# Patient Record
Sex: Female | Born: 1945 | Race: White | Hispanic: No | Marital: Married | Smoking: Current every day smoker
Health system: Southern US, Community
[De-identification: ages and names within clinical notes are randomized; demographics above are authoritative.]

## PROBLEM LIST (undated history)

## (undated) DIAGNOSIS — F419 Anxiety disorder, unspecified: Secondary | ICD-10-CM

## (undated) DIAGNOSIS — E039 Hypothyroidism, unspecified: Secondary | ICD-10-CM

## (undated) DIAGNOSIS — M1990 Unspecified osteoarthritis, unspecified site: Secondary | ICD-10-CM

## (undated) DIAGNOSIS — I10 Essential (primary) hypertension: Secondary | ICD-10-CM

## (undated) DIAGNOSIS — G629 Polyneuropathy, unspecified: Secondary | ICD-10-CM

## (undated) DIAGNOSIS — N182 Chronic kidney disease, stage 2 (mild): Secondary | ICD-10-CM

## (undated) DIAGNOSIS — K219 Gastro-esophageal reflux disease without esophagitis: Secondary | ICD-10-CM

## (undated) DIAGNOSIS — E119 Type 2 diabetes mellitus without complications: Secondary | ICD-10-CM

---

## 2001-05-11 ENCOUNTER — Ambulatory Visit (HOSPITAL_COMMUNITY): Admission: RE | Admit: 2001-05-11 | Discharge: 2001-05-11 | Payer: Self-pay

## 2017-06-14 DIAGNOSIS — E871 Hypo-osmolality and hyponatremia: Secondary | ICD-10-CM | POA: Diagnosis not present

## 2017-06-14 DIAGNOSIS — I10 Essential (primary) hypertension: Secondary | ICD-10-CM | POA: Diagnosis not present

## 2017-06-14 DIAGNOSIS — E876 Hypokalemia: Secondary | ICD-10-CM | POA: Diagnosis not present

## 2017-06-14 DIAGNOSIS — R531 Weakness: Secondary | ICD-10-CM | POA: Diagnosis not present

## 2017-06-14 DIAGNOSIS — F418 Other specified anxiety disorders: Secondary | ICD-10-CM | POA: Diagnosis not present

## 2017-06-14 DIAGNOSIS — Z720 Tobacco use: Secondary | ICD-10-CM | POA: Diagnosis not present

## 2017-06-14 DIAGNOSIS — R739 Hyperglycemia, unspecified: Secondary | ICD-10-CM

## 2017-06-14 DIAGNOSIS — E875 Hyperkalemia: Secondary | ICD-10-CM | POA: Diagnosis not present

## 2017-06-14 DIAGNOSIS — Z9114 Patient's other noncompliance with medication regimen: Secondary | ICD-10-CM

## 2017-06-14 DIAGNOSIS — E860 Dehydration: Secondary | ICD-10-CM | POA: Diagnosis not present

## 2017-06-15 DIAGNOSIS — Z720 Tobacco use: Secondary | ICD-10-CM | POA: Diagnosis not present

## 2017-06-15 DIAGNOSIS — R739 Hyperglycemia, unspecified: Secondary | ICD-10-CM | POA: Diagnosis not present

## 2017-06-15 DIAGNOSIS — E860 Dehydration: Secondary | ICD-10-CM | POA: Diagnosis not present

## 2017-06-15 DIAGNOSIS — F418 Other specified anxiety disorders: Secondary | ICD-10-CM | POA: Diagnosis not present

## 2017-06-15 DIAGNOSIS — E871 Hypo-osmolality and hyponatremia: Secondary | ICD-10-CM | POA: Diagnosis not present

## 2017-06-15 DIAGNOSIS — E876 Hypokalemia: Secondary | ICD-10-CM | POA: Diagnosis not present

## 2017-06-15 DIAGNOSIS — I10 Essential (primary) hypertension: Secondary | ICD-10-CM | POA: Diagnosis not present

## 2017-06-16 DIAGNOSIS — E860 Dehydration: Secondary | ICD-10-CM | POA: Diagnosis not present

## 2017-06-16 DIAGNOSIS — R739 Hyperglycemia, unspecified: Secondary | ICD-10-CM | POA: Diagnosis not present

## 2017-06-16 DIAGNOSIS — E876 Hypokalemia: Secondary | ICD-10-CM | POA: Diagnosis not present

## 2017-06-16 DIAGNOSIS — E871 Hypo-osmolality and hyponatremia: Secondary | ICD-10-CM | POA: Diagnosis not present

## 2017-06-17 DIAGNOSIS — E860 Dehydration: Secondary | ICD-10-CM | POA: Diagnosis not present

## 2017-06-17 DIAGNOSIS — E871 Hypo-osmolality and hyponatremia: Secondary | ICD-10-CM | POA: Diagnosis not present

## 2017-06-17 DIAGNOSIS — E876 Hypokalemia: Secondary | ICD-10-CM | POA: Diagnosis not present

## 2017-06-17 DIAGNOSIS — R739 Hyperglycemia, unspecified: Secondary | ICD-10-CM | POA: Diagnosis not present

## 2017-06-18 DIAGNOSIS — E871 Hypo-osmolality and hyponatremia: Secondary | ICD-10-CM | POA: Diagnosis not present

## 2017-06-18 DIAGNOSIS — E876 Hypokalemia: Secondary | ICD-10-CM | POA: Diagnosis not present

## 2017-06-18 DIAGNOSIS — R739 Hyperglycemia, unspecified: Secondary | ICD-10-CM | POA: Diagnosis not present

## 2017-06-18 DIAGNOSIS — F418 Other specified anxiety disorders: Secondary | ICD-10-CM | POA: Diagnosis not present

## 2017-06-18 DIAGNOSIS — I10 Essential (primary) hypertension: Secondary | ICD-10-CM | POA: Diagnosis not present

## 2017-06-18 DIAGNOSIS — E875 Hyperkalemia: Secondary | ICD-10-CM | POA: Diagnosis not present

## 2017-06-18 DIAGNOSIS — E860 Dehydration: Secondary | ICD-10-CM | POA: Diagnosis not present

## 2017-06-18 DIAGNOSIS — Z720 Tobacco use: Secondary | ICD-10-CM | POA: Diagnosis not present

## 2017-06-18 DIAGNOSIS — R531 Weakness: Secondary | ICD-10-CM | POA: Diagnosis not present

## 2017-06-18 DIAGNOSIS — Z9114 Patient's other noncompliance with medication regimen: Secondary | ICD-10-CM | POA: Diagnosis not present

## 2019-02-23 DIAGNOSIS — E119 Type 2 diabetes mellitus without complications: Secondary | ICD-10-CM | POA: Diagnosis not present

## 2019-02-23 DIAGNOSIS — I2510 Atherosclerotic heart disease of native coronary artery without angina pectoris: Secondary | ICD-10-CM | POA: Diagnosis not present

## 2019-02-23 DIAGNOSIS — G894 Chronic pain syndrome: Secondary | ICD-10-CM | POA: Diagnosis not present

## 2019-02-23 DIAGNOSIS — I10 Essential (primary) hypertension: Secondary | ICD-10-CM | POA: Diagnosis not present

## 2019-02-25 DIAGNOSIS — R1030 Lower abdominal pain, unspecified: Secondary | ICD-10-CM | POA: Diagnosis not present

## 2019-02-25 DIAGNOSIS — H1131 Conjunctival hemorrhage, right eye: Secondary | ICD-10-CM | POA: Diagnosis not present

## 2019-02-25 DIAGNOSIS — Z87440 Personal history of urinary (tract) infections: Secondary | ICD-10-CM | POA: Diagnosis not present

## 2019-02-25 DIAGNOSIS — R4182 Altered mental status, unspecified: Secondary | ICD-10-CM | POA: Diagnosis not present

## 2019-02-26 DIAGNOSIS — N390 Urinary tract infection, site not specified: Secondary | ICD-10-CM | POA: Diagnosis not present

## 2019-03-16 DIAGNOSIS — R451 Restlessness and agitation: Secondary | ICD-10-CM | POA: Diagnosis not present

## 2019-03-16 DIAGNOSIS — F419 Anxiety disorder, unspecified: Secondary | ICD-10-CM | POA: Diagnosis not present

## 2019-03-30 DIAGNOSIS — F419 Anxiety disorder, unspecified: Secondary | ICD-10-CM | POA: Diagnosis not present

## 2019-03-30 DIAGNOSIS — F341 Dysthymic disorder: Secondary | ICD-10-CM | POA: Diagnosis not present

## 2019-04-22 DIAGNOSIS — R54 Age-related physical debility: Secondary | ICD-10-CM | POA: Diagnosis not present

## 2019-04-22 DIAGNOSIS — E1165 Type 2 diabetes mellitus with hyperglycemia: Secondary | ICD-10-CM | POA: Diagnosis not present

## 2019-04-22 DIAGNOSIS — Z9111 Patient's noncompliance with dietary regimen: Secondary | ICD-10-CM | POA: Diagnosis not present

## 2019-04-22 DIAGNOSIS — R635 Abnormal weight gain: Secondary | ICD-10-CM | POA: Diagnosis not present

## 2019-05-04 DIAGNOSIS — F341 Dysthymic disorder: Secondary | ICD-10-CM | POA: Diagnosis not present

## 2019-05-04 DIAGNOSIS — F419 Anxiety disorder, unspecified: Secondary | ICD-10-CM | POA: Diagnosis not present

## 2019-05-04 DIAGNOSIS — F062 Psychotic disorder with delusions due to known physiological condition: Secondary | ICD-10-CM | POA: Diagnosis not present

## 2019-05-04 DIAGNOSIS — R451 Restlessness and agitation: Secondary | ICD-10-CM | POA: Diagnosis not present

## 2019-05-26 DIAGNOSIS — M179 Osteoarthritis of knee, unspecified: Secondary | ICD-10-CM | POA: Diagnosis not present

## 2019-05-26 DIAGNOSIS — F331 Major depressive disorder, recurrent, moderate: Secondary | ICD-10-CM | POA: Diagnosis not present

## 2019-05-26 DIAGNOSIS — I2510 Atherosclerotic heart disease of native coronary artery without angina pectoris: Secondary | ICD-10-CM | POA: Diagnosis not present

## 2019-05-26 DIAGNOSIS — K649 Unspecified hemorrhoids: Secondary | ICD-10-CM | POA: Diagnosis not present

## 2019-05-26 DIAGNOSIS — F22 Delusional disorders: Secondary | ICD-10-CM | POA: Diagnosis not present

## 2019-05-26 DIAGNOSIS — I10 Essential (primary) hypertension: Secondary | ICD-10-CM | POA: Diagnosis not present

## 2019-05-26 DIAGNOSIS — K5900 Constipation, unspecified: Secondary | ICD-10-CM | POA: Diagnosis not present

## 2019-05-26 DIAGNOSIS — G894 Chronic pain syndrome: Secondary | ICD-10-CM | POA: Diagnosis not present

## 2019-05-26 DIAGNOSIS — E785 Hyperlipidemia, unspecified: Secondary | ICD-10-CM | POA: Diagnosis not present

## 2019-05-26 DIAGNOSIS — E559 Vitamin D deficiency, unspecified: Secondary | ICD-10-CM | POA: Diagnosis not present

## 2019-05-26 DIAGNOSIS — E1165 Type 2 diabetes mellitus with hyperglycemia: Secondary | ICD-10-CM | POA: Diagnosis not present

## 2019-05-27 DIAGNOSIS — E785 Hyperlipidemia, unspecified: Secondary | ICD-10-CM | POA: Diagnosis not present

## 2019-05-27 DIAGNOSIS — I10 Essential (primary) hypertension: Secondary | ICD-10-CM | POA: Diagnosis not present

## 2019-05-27 DIAGNOSIS — D649 Anemia, unspecified: Secondary | ICD-10-CM | POA: Diagnosis not present

## 2019-06-01 DIAGNOSIS — E1165 Type 2 diabetes mellitus with hyperglycemia: Secondary | ICD-10-CM | POA: Diagnosis not present

## 2019-06-01 DIAGNOSIS — Z9111 Patient's noncompliance with dietary regimen: Secondary | ICD-10-CM | POA: Diagnosis not present

## 2019-06-01 DIAGNOSIS — Z5181 Encounter for therapeutic drug level monitoring: Secondary | ICD-10-CM | POA: Diagnosis not present

## 2019-06-04 DIAGNOSIS — R739 Hyperglycemia, unspecified: Secondary | ICD-10-CM | POA: Diagnosis not present

## 2019-06-04 DIAGNOSIS — F419 Anxiety disorder, unspecified: Secondary | ICD-10-CM | POA: Diagnosis not present

## 2019-06-04 DIAGNOSIS — E119 Type 2 diabetes mellitus without complications: Secondary | ICD-10-CM | POA: Diagnosis not present

## 2019-06-04 DIAGNOSIS — F0391 Unspecified dementia with behavioral disturbance: Secondary | ICD-10-CM | POA: Diagnosis not present

## 2019-06-15 DIAGNOSIS — F062 Psychotic disorder with delusions due to known physiological condition: Secondary | ICD-10-CM | POA: Diagnosis not present

## 2019-06-15 DIAGNOSIS — F419 Anxiety disorder, unspecified: Secondary | ICD-10-CM | POA: Diagnosis not present

## 2019-06-15 DIAGNOSIS — F341 Dysthymic disorder: Secondary | ICD-10-CM | POA: Diagnosis not present

## 2019-06-15 DIAGNOSIS — R451 Restlessness and agitation: Secondary | ICD-10-CM | POA: Diagnosis not present

## 2019-06-30 DIAGNOSIS — M79642 Pain in left hand: Secondary | ICD-10-CM | POA: Diagnosis not present

## 2019-07-08 DIAGNOSIS — Z23 Encounter for immunization: Secondary | ICD-10-CM | POA: Diagnosis not present

## 2019-07-28 DIAGNOSIS — F0390 Unspecified dementia without behavioral disturbance: Secondary | ICD-10-CM | POA: Diagnosis not present

## 2019-07-28 DIAGNOSIS — I10 Essential (primary) hypertension: Secondary | ICD-10-CM | POA: Diagnosis not present

## 2019-08-02 DIAGNOSIS — M1990 Unspecified osteoarthritis, unspecified site: Secondary | ICD-10-CM | POA: Diagnosis not present

## 2019-08-02 DIAGNOSIS — M25512 Pain in left shoulder: Secondary | ICD-10-CM | POA: Diagnosis not present

## 2019-08-02 DIAGNOSIS — G894 Chronic pain syndrome: Secondary | ICD-10-CM | POA: Diagnosis not present

## 2019-08-02 DIAGNOSIS — R54 Age-related physical debility: Secondary | ICD-10-CM | POA: Diagnosis not present

## 2019-08-03 DIAGNOSIS — F062 Psychotic disorder with delusions due to known physiological condition: Secondary | ICD-10-CM | POA: Diagnosis not present

## 2019-08-03 DIAGNOSIS — F419 Anxiety disorder, unspecified: Secondary | ICD-10-CM | POA: Diagnosis not present

## 2019-08-03 DIAGNOSIS — F341 Dysthymic disorder: Secondary | ICD-10-CM | POA: Diagnosis not present

## 2019-08-04 DIAGNOSIS — M79602 Pain in left arm: Secondary | ICD-10-CM | POA: Diagnosis not present

## 2019-08-04 DIAGNOSIS — I10 Essential (primary) hypertension: Secondary | ICD-10-CM | POA: Diagnosis not present

## 2019-09-01 DIAGNOSIS — Z20828 Contact with and (suspected) exposure to other viral communicable diseases: Secondary | ICD-10-CM | POA: Diagnosis not present

## 2019-09-01 DIAGNOSIS — R54 Age-related physical debility: Secondary | ICD-10-CM | POA: Diagnosis not present

## 2019-09-08 DIAGNOSIS — Z20828 Contact with and (suspected) exposure to other viral communicable diseases: Secondary | ICD-10-CM | POA: Diagnosis not present

## 2019-09-08 DIAGNOSIS — R54 Age-related physical debility: Secondary | ICD-10-CM | POA: Diagnosis not present

## 2019-09-15 DIAGNOSIS — R54 Age-related physical debility: Secondary | ICD-10-CM | POA: Diagnosis not present

## 2019-09-15 DIAGNOSIS — Z20828 Contact with and (suspected) exposure to other viral communicable diseases: Secondary | ICD-10-CM | POA: Diagnosis not present

## 2019-09-22 DIAGNOSIS — Z20828 Contact with and (suspected) exposure to other viral communicable diseases: Secondary | ICD-10-CM | POA: Diagnosis not present

## 2019-09-22 DIAGNOSIS — R54 Age-related physical debility: Secondary | ICD-10-CM | POA: Diagnosis not present

## 2019-09-29 DIAGNOSIS — R54 Age-related physical debility: Secondary | ICD-10-CM | POA: Diagnosis not present

## 2019-09-29 DIAGNOSIS — Z20828 Contact with and (suspected) exposure to other viral communicable diseases: Secondary | ICD-10-CM | POA: Diagnosis not present

## 2019-10-06 DIAGNOSIS — R54 Age-related physical debility: Secondary | ICD-10-CM | POA: Diagnosis not present

## 2019-10-06 DIAGNOSIS — Z20828 Contact with and (suspected) exposure to other viral communicable diseases: Secondary | ICD-10-CM | POA: Diagnosis not present

## 2019-10-13 DIAGNOSIS — R54 Age-related physical debility: Secondary | ICD-10-CM | POA: Diagnosis not present

## 2019-10-13 DIAGNOSIS — Z20828 Contact with and (suspected) exposure to other viral communicable diseases: Secondary | ICD-10-CM | POA: Diagnosis not present

## 2019-10-20 DIAGNOSIS — R54 Age-related physical debility: Secondary | ICD-10-CM | POA: Diagnosis not present

## 2019-10-20 DIAGNOSIS — Z20828 Contact with and (suspected) exposure to other viral communicable diseases: Secondary | ICD-10-CM | POA: Diagnosis not present

## 2019-10-25 DIAGNOSIS — R54 Age-related physical debility: Secondary | ICD-10-CM | POA: Diagnosis not present

## 2019-10-25 DIAGNOSIS — R490 Dysphonia: Secondary | ICD-10-CM | POA: Diagnosis not present

## 2019-10-25 DIAGNOSIS — U071 COVID-19: Secondary | ICD-10-CM | POA: Diagnosis not present

## 2019-10-26 DIAGNOSIS — M2550 Pain in unspecified joint: Secondary | ICD-10-CM | POA: Diagnosis not present

## 2019-10-26 DIAGNOSIS — U071 COVID-19: Secondary | ICD-10-CM | POA: Diagnosis not present

## 2019-10-26 DIAGNOSIS — R062 Wheezing: Secondary | ICD-10-CM | POA: Diagnosis not present

## 2019-10-26 DIAGNOSIS — I10 Essential (primary) hypertension: Secondary | ICD-10-CM | POA: Diagnosis not present

## 2019-10-26 DIAGNOSIS — R0602 Shortness of breath: Secondary | ICD-10-CM | POA: Diagnosis not present

## 2019-10-26 DIAGNOSIS — F17200 Nicotine dependence, unspecified, uncomplicated: Secondary | ICD-10-CM | POA: Diagnosis not present

## 2019-10-26 DIAGNOSIS — J029 Acute pharyngitis, unspecified: Secondary | ICD-10-CM | POA: Diagnosis not present

## 2019-10-26 DIAGNOSIS — Z7401 Bed confinement status: Secondary | ICD-10-CM | POA: Diagnosis not present

## 2019-10-26 DIAGNOSIS — R0902 Hypoxemia: Secondary | ICD-10-CM | POA: Diagnosis not present

## 2019-10-26 DIAGNOSIS — R069 Unspecified abnormalities of breathing: Secondary | ICD-10-CM | POA: Diagnosis not present

## 2019-10-26 DIAGNOSIS — I700 Atherosclerosis of aorta: Secondary | ICD-10-CM | POA: Diagnosis not present

## 2019-10-26 DIAGNOSIS — R410 Disorientation, unspecified: Secondary | ICD-10-CM | POA: Diagnosis not present

## 2019-10-29 ENCOUNTER — Encounter (HOSPITAL_COMMUNITY): Payer: Self-pay | Admitting: Internal Medicine

## 2019-10-29 ENCOUNTER — Other Ambulatory Visit: Payer: Self-pay

## 2019-10-29 ENCOUNTER — Emergency Department (HOSPITAL_COMMUNITY): Payer: Medicare HMO

## 2019-10-29 ENCOUNTER — Inpatient Hospital Stay (HOSPITAL_COMMUNITY)
Admission: EM | Admit: 2019-10-29 | Discharge: 2019-11-06 | DRG: 177 | Disposition: A | Discharge status: Home / Self Care | Payer: Medicare HMO | Attending: Internal Medicine | Admitting: Internal Medicine

## 2019-10-29 DIAGNOSIS — K219 Gastro-esophageal reflux disease without esophagitis: Secondary | ICD-10-CM | POA: Diagnosis present

## 2019-10-29 DIAGNOSIS — T380X5A Adverse effect of glucocorticoids and synthetic analogues, initial encounter: Secondary | ICD-10-CM | POA: Diagnosis not present

## 2019-10-29 DIAGNOSIS — Z794 Long term (current) use of insulin: Secondary | ICD-10-CM

## 2019-10-29 DIAGNOSIS — E1122 Type 2 diabetes mellitus with diabetic chronic kidney disease: Secondary | ICD-10-CM

## 2019-10-29 DIAGNOSIS — Z7401 Bed confinement status: Secondary | ICD-10-CM | POA: Diagnosis not present

## 2019-10-29 DIAGNOSIS — J441 Chronic obstructive pulmonary disease with (acute) exacerbation: Secondary | ICD-10-CM | POA: Diagnosis not present

## 2019-10-29 DIAGNOSIS — J9600 Acute respiratory failure, unspecified whether with hypoxia or hypercapnia: Secondary | ICD-10-CM | POA: Diagnosis not present

## 2019-10-29 DIAGNOSIS — J9601 Acute respiratory failure with hypoxia: Secondary | ICD-10-CM

## 2019-10-29 DIAGNOSIS — F17200 Nicotine dependence, unspecified, uncomplicated: Secondary | ICD-10-CM | POA: Diagnosis present

## 2019-10-29 DIAGNOSIS — F419 Anxiety disorder, unspecified: Secondary | ICD-10-CM | POA: Diagnosis present

## 2019-10-29 DIAGNOSIS — I129 Hypertensive chronic kidney disease with stage 1 through stage 4 chronic kidney disease, or unspecified chronic kidney disease: Secondary | ICD-10-CM | POA: Diagnosis present

## 2019-10-29 DIAGNOSIS — M2550 Pain in unspecified joint: Secondary | ICD-10-CM | POA: Diagnosis not present

## 2019-10-29 DIAGNOSIS — N182 Chronic kidney disease, stage 2 (mild): Secondary | ICD-10-CM

## 2019-10-29 DIAGNOSIS — Z7982 Long term (current) use of aspirin: Secondary | ICD-10-CM | POA: Diagnosis not present

## 2019-10-29 DIAGNOSIS — N179 Acute kidney failure, unspecified: Secondary | ICD-10-CM

## 2019-10-29 DIAGNOSIS — E119 Type 2 diabetes mellitus without complications: Secondary | ICD-10-CM

## 2019-10-29 DIAGNOSIS — U071 COVID-19: Principal | ICD-10-CM | POA: Diagnosis present

## 2019-10-29 DIAGNOSIS — R000 Tachycardia, unspecified: Secondary | ICD-10-CM | POA: Diagnosis not present

## 2019-10-29 DIAGNOSIS — D631 Anemia in chronic kidney disease: Secondary | ICD-10-CM | POA: Diagnosis present

## 2019-10-29 DIAGNOSIS — E1142 Type 2 diabetes mellitus with diabetic polyneuropathy: Secondary | ICD-10-CM | POA: Diagnosis present

## 2019-10-29 DIAGNOSIS — R0902 Hypoxemia: Secondary | ICD-10-CM | POA: Diagnosis not present

## 2019-10-29 DIAGNOSIS — Z8249 Family history of ischemic heart disease and other diseases of the circulatory system: Secondary | ICD-10-CM | POA: Diagnosis not present

## 2019-10-29 DIAGNOSIS — R509 Fever, unspecified: Secondary | ICD-10-CM | POA: Diagnosis not present

## 2019-10-29 DIAGNOSIS — E039 Hypothyroidism, unspecified: Secondary | ICD-10-CM | POA: Diagnosis present

## 2019-10-29 DIAGNOSIS — I700 Atherosclerosis of aorta: Secondary | ICD-10-CM | POA: Diagnosis present

## 2019-10-29 DIAGNOSIS — F329 Major depressive disorder, single episode, unspecified: Secondary | ICD-10-CM | POA: Diagnosis present

## 2019-10-29 DIAGNOSIS — T501X5A Adverse effect of loop [high-ceiling] diuretics, initial encounter: Secondary | ICD-10-CM | POA: Diagnosis not present

## 2019-10-29 DIAGNOSIS — R0602 Shortness of breath: Secondary | ICD-10-CM | POA: Diagnosis not present

## 2019-10-29 DIAGNOSIS — E871 Hypo-osmolality and hyponatremia: Secondary | ICD-10-CM | POA: Diagnosis not present

## 2019-10-29 DIAGNOSIS — I10 Essential (primary) hypertension: Secondary | ICD-10-CM | POA: Diagnosis not present

## 2019-10-29 DIAGNOSIS — E1165 Type 2 diabetes mellitus with hyperglycemia: Secondary | ICD-10-CM | POA: Diagnosis not present

## 2019-10-29 DIAGNOSIS — J9811 Atelectasis: Secondary | ICD-10-CM | POA: Diagnosis not present

## 2019-10-29 DIAGNOSIS — M1990 Unspecified osteoarthritis, unspecified site: Secondary | ICD-10-CM | POA: Diagnosis present

## 2019-10-29 DIAGNOSIS — R0989 Other specified symptoms and signs involving the circulatory and respiratory systems: Secondary | ICD-10-CM | POA: Diagnosis not present

## 2019-10-29 DIAGNOSIS — R439 Unspecified disturbances of smell and taste: Secondary | ICD-10-CM | POA: Diagnosis present

## 2019-10-29 DIAGNOSIS — R54 Age-related physical debility: Secondary | ICD-10-CM | POA: Diagnosis not present

## 2019-10-29 DIAGNOSIS — R05 Cough: Secondary | ICD-10-CM | POA: Diagnosis not present

## 2019-10-29 HISTORY — DX: Type 2 diabetes mellitus without complications: E11.9

## 2019-10-29 HISTORY — DX: Unspecified osteoarthritis, unspecified site: M19.90

## 2019-10-29 HISTORY — DX: Hypothyroidism, unspecified: E03.9

## 2019-10-29 HISTORY — DX: Chronic kidney disease, stage 2 (mild): N18.2

## 2019-10-29 HISTORY — DX: Gastro-esophageal reflux disease without esophagitis: K21.9

## 2019-10-29 HISTORY — DX: Essential (primary) hypertension: I10

## 2019-10-29 HISTORY — DX: Anxiety disorder, unspecified: F41.9

## 2019-10-29 HISTORY — DX: Polyneuropathy, unspecified: G62.9

## 2019-10-29 LAB — COMPREHENSIVE METABOLIC PANEL
ALT: 14 U/L (ref 0–44)
AST: 26 U/L (ref 15–41)
Albumin: 2.9 g/dL — ABNORMAL LOW (ref 3.5–5.0)
Alkaline Phosphatase: 74 U/L (ref 38–126)
Anion gap: 10 (ref 5–15)
BUN: 36 mg/dL — ABNORMAL HIGH (ref 8–23)
CO2: 20 mmol/L — ABNORMAL LOW (ref 22–32)
Calcium: 8.2 mg/dL — ABNORMAL LOW (ref 8.9–10.3)
Chloride: 101 mmol/L (ref 98–111)
Creatinine, Ser: 2.24 mg/dL — ABNORMAL HIGH (ref 0.44–1.00)
GFR calc Af Amer: 24 mL/min — ABNORMAL LOW (ref >60)
GFR calc non Af Amer: 21 mL/min — ABNORMAL LOW (ref >60)
Glucose, Bld: 104 mg/dL — ABNORMAL HIGH (ref 70–99)
Potassium: 4.6 mmol/L (ref 3.5–5.1)
Sodium: 131 mmol/L — ABNORMAL LOW (ref 135–145)
Total Bilirubin: 0.6 mg/dL (ref 0.3–1.2)
Total Protein: 5.8 g/dL — ABNORMAL LOW (ref 6.5–8.1)

## 2019-10-29 LAB — D-DIMER, QUANTITATIVE: D-Dimer, Quant: 1.29 ug/mL-FEU — ABNORMAL HIGH (ref 0.00–0.50)

## 2019-10-29 LAB — CBC WITH DIFFERENTIAL/PLATELET
Abs Immature Granulocytes: 0.03 10*3/uL (ref 0.00–0.07)
Basophils Absolute: 0 10*3/uL (ref 0.0–0.1)
Basophils Relative: 0 %
Eosinophils Absolute: 0.1 10*3/uL (ref 0.0–0.5)
Eosinophils Relative: 1 %
HCT: 37.6 % (ref 36.0–46.0)
Hemoglobin: 11 g/dL — ABNORMAL LOW (ref 12.0–15.0)
Immature Granulocytes: 0 %
Lymphocytes Relative: 15 %
Lymphs Abs: 1.4 10*3/uL (ref 0.7–4.0)
MCH: 26.4 pg (ref 26.0–34.0)
MCHC: 29.3 g/dL — ABNORMAL LOW (ref 30.0–36.0)
MCV: 90.2 fL (ref 80.0–100.0)
Monocytes Absolute: 0.9 10*3/uL (ref 0.1–1.0)
Monocytes Relative: 10 %
Neutro Abs: 6.6 10*3/uL (ref 1.7–7.7)
Neutrophils Relative %: 74 %
Platelets: 244 10*3/uL (ref 150–400)
RBC: 4.17 MIL/uL (ref 3.87–5.11)
RDW: 14.6 % (ref 11.5–15.5)
WBC: 8.9 10*3/uL (ref 4.0–10.5)
nRBC: 0 % (ref 0.0–0.2)

## 2019-10-29 LAB — FERRITIN: Ferritin: 23 ng/mL (ref 11–307)

## 2019-10-29 LAB — HEMOGLOBIN A1C
Hgb A1c MFr Bld: 8.3 % — ABNORMAL HIGH (ref 4.8–5.6)
Mean Plasma Glucose: 191.51 mg/dL

## 2019-10-29 LAB — POC SARS CORONAVIRUS 2 AG -  ED: SARS Coronavirus 2 Ag: POSITIVE — AB

## 2019-10-29 LAB — LACTATE DEHYDROGENASE: LDH: 291 U/L — ABNORMAL HIGH (ref 98–192)

## 2019-10-29 LAB — PROCALCITONIN: Procalcitonin: 0.31 ng/mL

## 2019-10-29 LAB — CBG MONITORING, ED: Glucose-Capillary: 73 mg/dL (ref 70–99)

## 2019-10-29 LAB — C-REACTIVE PROTEIN: CRP: 5.5 mg/dL — ABNORMAL HIGH (ref <1.0)

## 2019-10-29 LAB — LACTIC ACID, PLASMA: Lactic Acid, Venous: 1.1 mmol/L (ref 0.5–1.9)

## 2019-10-29 LAB — TRIGLYCERIDES: Triglycerides: 161 mg/dL — ABNORMAL HIGH (ref <150)

## 2019-10-29 LAB — FIBRINOGEN: Fibrinogen: 545 mg/dL — ABNORMAL HIGH (ref 210–475)

## 2019-10-29 MED ORDER — PANTOPRAZOLE SODIUM 40 MG PO TBEC
80.0000 mg | DELAYED_RELEASE_TABLET | Freq: Every day | ORAL | Status: DC
  Administered 2019-10-30 – 2019-11-05 (×7): 80 mg via ORAL
  Filled 2019-10-29 (×7): qty 2

## 2019-10-29 MED ORDER — LORAZEPAM 0.5 MG PO TABS
0.2500 mg | ORAL_TABLET | Freq: Every day | ORAL | Status: DC
  Administered 2019-10-30 – 2019-11-05 (×7): 0.25 mg via ORAL
  Filled 2019-10-29 (×8): qty 1

## 2019-10-29 MED ORDER — ZINC SULFATE 220 (50 ZN) MG PO CAPS
220.0000 mg | ORAL_CAPSULE | Freq: Every day | ORAL | Status: DC
  Administered 2019-10-30 – 2019-11-05 (×7): 220 mg via ORAL
  Filled 2019-10-29 (×7): qty 1

## 2019-10-29 MED ORDER — VENLAFAXINE HCL ER 75 MG PO CP24
150.0000 mg | ORAL_CAPSULE | Freq: Every day | ORAL | Status: DC
  Administered 2019-10-30 – 2019-11-05 (×7): 150 mg via ORAL
  Filled 2019-10-29: qty 1
  Filled 2019-10-29 (×6): qty 2

## 2019-10-29 MED ORDER — GUAIFENESIN-DM 100-10 MG/5ML PO SYRP
10.0000 mL | ORAL_SOLUTION | ORAL | Status: DC | PRN
  Administered 2019-10-30 – 2019-11-03 (×3): 10 mL via ORAL
  Filled 2019-10-29 (×3): qty 10

## 2019-10-29 MED ORDER — LORAZEPAM 0.5 MG PO TABS
0.2500 mg | ORAL_TABLET | Freq: Two times a day (BID) | ORAL | Status: DC | PRN
  Administered 2019-10-30 (×2): 0.25 mg via ORAL
  Filled 2019-10-29: qty 1

## 2019-10-29 MED ORDER — ONDANSETRON HCL 4 MG PO TABS: 4.0000 mg | ORAL_TABLET | Freq: Four times a day (QID) | ORAL | Status: DC | PRN

## 2019-10-29 MED ORDER — MELATONIN 3 MG PO TABS
6.0000 mg | ORAL_TABLET | Freq: Every evening | ORAL | Status: DC | PRN
  Administered 2019-11-03: 6 mg via ORAL
  Filled 2019-10-29 (×3): qty 2

## 2019-10-29 MED ORDER — AMLODIPINE BESYLATE 10 MG PO TABS
10.0000 mg | ORAL_TABLET | Freq: Every day | ORAL | Status: DC
  Administered 2019-10-30 – 2019-11-05 (×7): 10 mg via ORAL
  Filled 2019-10-29: qty 2
  Filled 2019-10-29 (×2): qty 1
  Filled 2019-10-29: qty 2
  Filled 2019-10-29 (×2): qty 1
  Filled 2019-10-29: qty 2
  Filled 2019-10-29 (×2): qty 1

## 2019-10-29 MED ORDER — SODIUM CHLORIDE 0.9 % IV SOLN: Freq: Once | INTRAVENOUS | Status: AC

## 2019-10-29 MED ORDER — ALBUTEROL SULFATE HFA 108 (90 BASE) MCG/ACT IN AERS
2.0000 | INHALATION_SPRAY | Freq: Four times a day (QID) | RESPIRATORY_TRACT | Status: DC
  Administered 2019-10-29 – 2019-10-31 (×6): 2 via RESPIRATORY_TRACT
  Filled 2019-10-29 (×2): qty 6.7

## 2019-10-29 MED ORDER — SODIUM CHLORIDE 0.9 % IV SOLN
100.0000 mg | Freq: Every day | INTRAVENOUS | Status: AC
  Administered 2019-10-30 – 2019-11-02 (×4): 100 mg via INTRAVENOUS
  Filled 2019-10-29 (×4): qty 20

## 2019-10-29 MED ORDER — RISPERIDONE 0.5 MG PO TABS
0.2500 mg | ORAL_TABLET | Freq: Every day | ORAL | Status: DC
  Administered 2019-10-29 – 2019-11-05 (×8): 0.25 mg via ORAL
  Filled 2019-10-29 (×9): qty 1

## 2019-10-29 MED ORDER — MIRTAZAPINE 15 MG PO TABS
45.0000 mg | ORAL_TABLET | Freq: Every day | ORAL | Status: DC
  Administered 2019-10-30 – 2019-11-05 (×8): 45 mg via ORAL
  Filled 2019-10-29 (×8): qty 3
  Filled 2019-10-29: qty 1

## 2019-10-29 MED ORDER — SODIUM CHLORIDE 0.9 % IV BOLUS
500.0000 mL | Freq: Once | INTRAVENOUS | Status: AC
  Administered 2019-10-29: 500 mL via INTRAVENOUS

## 2019-10-29 MED ORDER — HEPARIN SODIUM (PORCINE) 5000 UNIT/ML IJ SOLN
5000.0000 [IU] | Freq: Three times a day (TID) | INTRAMUSCULAR | Status: DC
  Administered 2019-10-29 – 2019-11-05 (×19): 5000 [IU] via SUBCUTANEOUS
  Filled 2019-10-29 (×19): qty 1

## 2019-10-29 MED ORDER — SIMVASTATIN 20 MG PO TABS
20.0000 mg | ORAL_TABLET | Freq: Every day | ORAL | Status: DC
  Administered 2019-10-30 – 2019-11-05 (×8): 20 mg via ORAL
  Filled 2019-10-29 (×8): qty 1

## 2019-10-29 MED ORDER — SODIUM CHLORIDE 0.9 % IV SOLN
200.0000 mg | Freq: Once | INTRAVENOUS | Status: AC
  Administered 2019-10-29: 200 mg via INTRAVENOUS
  Filled 2019-10-29: qty 40

## 2019-10-29 MED ORDER — DEXAMETHASONE 6 MG PO TABS
6.0000 mg | ORAL_TABLET | ORAL | Status: DC
  Administered 2019-10-29 – 2019-11-05 (×8): 6 mg via ORAL
  Filled 2019-10-29 (×5): qty 1
  Filled 2019-10-29: qty 2
  Filled 2019-10-29: qty 1
  Filled 2019-10-29: qty 2

## 2019-10-29 MED ORDER — ASPIRIN EC 81 MG PO TBEC
81.0000 mg | DELAYED_RELEASE_TABLET | Freq: Every day | ORAL | Status: DC
  Administered 2019-10-30 – 2019-11-05 (×7): 81 mg via ORAL
  Filled 2019-10-29 (×7): qty 1

## 2019-10-29 MED ORDER — ASCORBIC ACID 500 MG PO TABS
500.0000 mg | ORAL_TABLET | Freq: Every day | ORAL | Status: DC
  Administered 2019-10-30 – 2019-11-05 (×7): 500 mg via ORAL
  Filled 2019-10-29 (×7): qty 1

## 2019-10-29 MED ORDER — INSULIN ASPART 100 UNIT/ML ~~LOC~~ SOLN
0.0000 [IU] | Freq: Three times a day (TID) | SUBCUTANEOUS | Status: DC
  Administered 2019-10-30: 3 [IU] via SUBCUTANEOUS
  Administered 2019-10-30: 5 [IU] via SUBCUTANEOUS
  Administered 2019-10-30: 3 [IU] via SUBCUTANEOUS
  Administered 2019-10-31: 5 [IU] via SUBCUTANEOUS
  Administered 2019-10-31: 11 [IU] via SUBCUTANEOUS
  Administered 2019-10-31: 2 [IU] via SUBCUTANEOUS
  Administered 2019-11-01: 3 [IU] via SUBCUTANEOUS
  Administered 2019-11-01: 5 [IU] via SUBCUTANEOUS
  Administered 2019-11-01: 3 [IU] via SUBCUTANEOUS
  Administered 2019-11-02: 2 [IU] via SUBCUTANEOUS
  Administered 2019-11-03: 5 [IU] via SUBCUTANEOUS
  Administered 2019-11-04: 8 [IU] via SUBCUTANEOUS
  Administered 2019-11-04: 5 [IU] via SUBCUTANEOUS
  Administered 2019-11-04: 11 [IU] via SUBCUTANEOUS
  Administered 2019-11-05: 3 [IU] via SUBCUTANEOUS
  Administered 2019-11-05: 5 [IU] via SUBCUTANEOUS
  Administered 2019-11-05: 8 [IU] via SUBCUTANEOUS

## 2019-10-29 MED ORDER — HYDROCOD POLST-CPM POLST ER 10-8 MG/5ML PO SUER: 5.0000 mL | Freq: Two times a day (BID) | ORAL | Status: DC | PRN

## 2019-10-29 MED ORDER — BACID PO TABS
1.0000 | ORAL_TABLET | Freq: Every day | ORAL | Status: DC
  Administered 2019-10-30 – 2019-11-05 (×7): 1 via ORAL
  Filled 2019-10-29 (×7): qty 1

## 2019-10-29 MED ORDER — SODIUM CHLORIDE 0.9 % IV SOLN: INTRAVENOUS | Status: DC

## 2019-10-29 MED ORDER — ACETAMINOPHEN 325 MG PO TABS: 650.0000 mg | ORAL_TABLET | Freq: Four times a day (QID) | ORAL | Status: DC | PRN

## 2019-10-29 MED ORDER — VITAMIN D 25 MCG (1000 UNIT) PO TABS
1000.0000 [IU] | ORAL_TABLET | Freq: Every day | ORAL | Status: DC
  Administered 2019-10-30 – 2019-11-05 (×7): 1000 [IU] via ORAL
  Filled 2019-10-29 (×7): qty 1

## 2019-10-29 MED ORDER — INSULIN GLARGINE 100 UNIT/ML ~~LOC~~ SOLN
40.0000 [IU] | Freq: Every day | SUBCUTANEOUS | Status: DC
  Administered 2019-10-30 – 2019-11-05 (×7): 40 [IU] via SUBCUTANEOUS
  Filled 2019-10-29 (×8): qty 0.4

## 2019-10-29 MED ORDER — FLUTICASONE PROPIONATE HFA 44 MCG/ACT IN AERO
1.0000 | INHALATION_SPRAY | Freq: Two times a day (BID) | RESPIRATORY_TRACT | Status: DC
  Administered 2019-10-30 – 2019-11-05 (×14): 1 via RESPIRATORY_TRACT
  Filled 2019-10-29: qty 10.6

## 2019-10-29 MED ORDER — ENOXAPARIN SODIUM 40 MG/0.4ML ~~LOC~~ SOLN: 40.0000 mg | SUBCUTANEOUS | Status: DC

## 2019-10-29 MED ORDER — TRAZODONE HCL 50 MG PO TABS
100.0000 mg | ORAL_TABLET | Freq: Every day | ORAL | Status: DC
  Administered 2019-10-30 – 2019-11-05 (×8): 100 mg via ORAL
  Filled 2019-10-29 (×2): qty 2
  Filled 2019-10-29: qty 1
  Filled 2019-10-29: qty 2
  Filled 2019-10-29: qty 1
  Filled 2019-10-29 (×3): qty 2

## 2019-10-29 MED ORDER — ONDANSETRON HCL 4 MG/2ML IJ SOLN: 4.0000 mg | Freq: Four times a day (QID) | INTRAMUSCULAR | Status: DC | PRN

## 2019-10-29 MED ORDER — POLYVINYL ALCOHOL 1.4 % OP SOLN
Freq: Two times a day (BID) | OPHTHALMIC | Status: DC
  Administered 2019-10-30 – 2019-11-01 (×2): 2 [drp] via OPHTHALMIC
  Filled 2019-10-29: qty 15

## 2019-10-29 NOTE — H&P (Signed)
History and Physical    Kathy Caldwell [id number] DOB: 1946/03/23 DOA: 10/29/2019  PCP: Patient, No Pcp Per  Patient coming from: [place] have personally briefly reviewed patient's old medical records in [place]  Chief Complaint: Hypoxia  HPI: Kathy Caldwell is a 74 y.o. female with medical history significant of DM2, HTN.  Patient tested positive for COVID several days ago.  Patient seen at [facility] on 1/5, CT chest neg and not hypoxic then.  Patient feeling worse since that visit.  Has loss of taste but not smell.  No fever, has cough.  Was satting 84% at NH today so stent in by EMS.   ED Course: Satting 86% with ambulation on room air, improved to 90s on 2L.  COVID positive.  CRP 5.5, D.Dimer 1.29.  Creat 2.2 up from 1.4 baseline, was 1.6 just 3 days ago at [facility].   Review of Systems: As per HPI, otherwise all review of systems negative.  Past Medical History:  Diagnosis Date  . Anxiety   . CKD (chronic kidney disease) stage 2, GFR 60-89 ml/min   . DM2 (diabetes mellitus, type 2) ([place])   . GERD (gastroesophageal reflux disease)   . HTN (hypertension)   . Hypothyroidism   . Osteoarthritis   . Peripheral neuropathy     History reviewed. No pertinent surgical history.   reports that she has been smoking. She does not have any smokeless tobacco history on file. She reports previous alcohol use. She reports that she does not use drugs.  No Known Allergies  Family History  Problem Relation Age of Onset  . Hypertension Mother   . Hypertension Father   . COPD Other      Prior to Admission medications   Medication Sig Start Date End Date Taking? Authorizing Provider  acetaminophen (TYLENOL) 325 MG tablet Take 650 mg by mouth 4 (four) times daily.   Yes [provider]  allopurinol (ZYLOPRIM) 100 MG tablet Take 100 mg by mouth daily. 08/23/19  Yes [provider]  amLODipine (NORVASC) 10 MG tablet Take 10 mg by mouth daily.  08/18/19  Yes [provider]  Ascorbic Acid (VITAMIN C) 500 MG CAPS Take 500 mg by mouth daily.   Yes [provider]  aspirin EC 81 MG tablet Take 81 mg by mouth daily.   Yes [provider]  Cellulose (UNIFIBER) POWD Take 15 mLs by mouth daily. Mix in 8 oz water and drink   Yes [provider]  cholecalciferol (VITAMIN D3) 25 MCG (1000 UT) tablet Take 1,000 Units by mouth daily.   Yes [provider]  fluticasone (FLOVENT HFA) 44 MCG/ACT inhaler Inhale 1 puff into the lungs 2 (two) times daily.   Yes [provider]  furosemide (LASIX) 20 MG tablet Take 20 mg by mouth daily. 08/23/19  Yes [provider]  glipiZIDE (GLUCOTROL XL) 10 MG 24 hr tablet Take 10 mg by mouth 2 (two) times daily. 08/26/19  Yes [provider]  guaiFENesin-dextromethorphan (ROBITUSSIN DM) 100-10 MG/5ML syrup Take 10 mLs by mouth every 4 (four) hours as needed for cough.   Yes [provider]  insulin aspart (NOVOLOG FLEXPEN) 100 UNIT/ML FlexPen Inject 0-10 Units into the skin See admin instructions. Inject 7 units subcutaneously twice daily at lunch and supper plus 0-10 units  before meals and at bedtime per sliding scale: CBG 0-151 0 units, 151-200 1 units, 201-250 2 units, 251-300 4 units, 301-350 6 units, 351-400 8  units, 401-450 10 units, >450 give 10 units and call MD   Yes [provider]  insulin glargine (LANTUS) 100 unit/mL SOPN Inject 53 Units into the skin at bedtime.   Yes [provider]  Lactobacillus Rhamnosus, GG, (CULTURELLE) CAPS Take 1 capsule by mouth daily.   Yes [provider]  lisinopril (ZESTRIL) 20 MG tablet Take 30 mg by mouth 2 (two) times daily. 08/26/19  Yes [provider]  loperamide (IMODIUM A-D) 2 MG tablet Take 2-4 mg by mouth See admin instructions. Take 2 tablets (4 mg) by mouth after 1st loose stool, then take 1 tablet (2 mg) after each loose stool (no more than 4 tabs in 24  hours)   Yes [provider]  LORazepam (ATIVAN) 0.5 MG tablet Take 0.25 mg by mouth See admin instructions. Take 1/2 tablet (0.25 mg) by mouth daily, may also take 1/2 tablet (0.25 mg) every 12 hours as needed for anxiety/agitation 09/03/19  Yes [provider]  Melatonin 3 MG TABS Take 6 mg by mouth at bedtime as needed (insomnia).   Yes [provider]  Menthol, Topical Analgesic, (BIOFREEZE EX) Apply 1 application topically See admin instructions. Apply topically to left shoulder daily as needed for pain   Yes [provider]  mirtazapine (REMERON) 45 MG tablet Take 45 mg by mouth at bedtime. 08/19/19  Yes [provider]  omeprazole (PRILOSEC) 40 MG capsule Take 40 mg by mouth daily. 08/13/19  Yes [provider]  OVER THE COUNTER MEDICATION Apply 1 application topically See admin instructions. Preparation H cream - apply topically to external hemorrhoids daily as needed for pain   Yes [provider]  Polyethyl Glycol-Propyl Glycol (SYSTANE OP) Place 1 drop into both eyes 2 (two) times daily.   Yes [provider]  risperiDONE (RISPERDAL) 0.25 MG tablet Take 0.25 mg by mouth at bedtime. 08/30/19  Yes [provider]  simvastatin (ZOCOR) 20 MG tablet Take 20 mg by mouth at bedtime.  08/16/19  Yes [provider]  traZODone (DESYREL) 100 MG tablet Take 100 mg by mouth at bedtime. 08/14/19  Yes [provider]  venlafaxine XR (EFFEXOR-XR) 150 MG 24 hr capsule Take 150 mg by mouth daily. 09/02/19  Yes [provider]  Zinc 50 MG TABS Take 50 mg by mouth daily.   Yes [provider]    Physical Exam: Vitals:   10/29/19 1830 10/29/19 1845 10/29/19 1915 10/29/19 2100  BP: 122/66   (!) 115/55  Pulse:  100 100 (!) 102  Resp: 16 17 20 15   Temp:      TempSrc:      SpO2:  97% 98% 97%    Constitutional: NAD, calm, comfortable Eyes: PERRL, lids and conjunctivae normal ENMT: Mucous  membranes are moist. Posterior pharynx clear of any exudate or lesions.Normal dentition.  Neck: normal, supple, no masses, no thyromegaly Respiratory: clear to auscultation bilaterally, no wheezing, no crackles. Normal respiratory effort. No accessory muscle use.  Cardiovascular: Regular rate and rhythm, no murmurs / rubs / gallops. No extremity edema. 2+ pedal pulses. No carotid bruits.  Abdomen: no tenderness, no masses palpated. No hepatosplenomegaly. Bowel sounds positive.  Musculoskeletal: no clubbing / cyanosis. No joint deformity upper and lower extremities. Good ROM, no contractures. Normal muscle tone.  Skin: no rashes, lesions, ulcers. No induration Neurologic: CN 2-12 grossly intact. Sensation intact, DTR normal. Strength 5/5 in all 4.  Psychiatric: Normal judgment and insight. Alert and oriented x 3. Normal  mood.    Labs on Admission: I have personally reviewed following labs and imaging studies  CBC: Recent Labs  Lab 10/29/19 1904  WBC 8.9  NEUTROABS 6.6  HGB 11.0*  HCT 37.6  MCV 90.2  PLT [id number]   Basic Metabolic Panel: Recent Labs  Lab 10/29/19 1904  NA 131*  K 4.6  CL 101  CO2 20*  GLUCOSE 104*  BUN 36*  CREATININE 2.24*  CALCIUM 8.2*   GFR: CrCl cannot be calculated (Unknown ideal weight.). Liver Function Tests: Recent Labs  Lab 10/29/19 1904  AST 26  ALT 14  ALKPHOS 74  BILITOT 0.6  PROT 5.8*  ALBUMIN 2.9*   No results for input(s): LIPASE, AMYLASE in the last 168 hours. No results for input(s): AMMONIA in the last 168 hours. Coagulation Profile: No results for input(s): INR, PROTIME in the last 168 hours. Cardiac Enzymes: No results for input(s): CKTOTAL, CKMB, CKMBINDEX, TROPONINI in the last 168 hours. BNP (last 3 results) No results for input(s): PROBNP in the last 8760 hours. HbA1C: No results for input(s): HGBA1C in the last 72 hours. CBG: No results for input(s): GLUCAP in the last 168 hours. Lipid Profile: Recent Labs     10/29/19 1904  TRIG 161*   Thyroid Function Tests: No results for input(s): TSH, T4TOTAL, FREET4, T3FREE, THYROIDAB in the last 72 hours. Anemia Panel: Recent Labs    10/29/19 1904  FERRITIN 23   Urine analysis: No results found for: COLORURINE, APPEARANCEUR, LABSPEC, PHURINE, GLUCOSEU, HGBUR, BILIRUBINUR, KETONESUR, PROTEINUR, UROBILINOGEN, NITRITE, LEUKOCYTESUR  Radiological Exams on Admission: DG Chest Port 1 View  Result Date: 10/29/2019 CLINICAL DATA:  Hypoxia. EXAM: PORTABLE CHEST 1 VIEW COMPARISON:  None. FINDINGS: Very mild atelectasis is seen within the left lung base. There is no evidence of acute infiltrate, pleural effusion or pneumothorax. The heart size and mediastinal contours are within normal limits. The visualized skeletal structures are unremarkable. IMPRESSION: 1. Very mild left basilar atelectasis. Electronically Signed   By: Virgina Norfolk M.D.   On: 10/29/2019 18:53    EKG: Independently reviewed.  Assessment/Plan Principal Problem:   Acute hypoxemic respiratory failure due to COVID-19 [facility]) Active Problems:   HTN (hypertension)   DM2 (diabetes mellitus, type 2) (HCC)   AKI (acute kidney injury) ([place])   Anxiety and depression    1. COVID-19 with new O2 requirement - 1. COVID pathway 2. remdesivir 3. Decadron 4. procalcitonin pending 5. Daily labs 6. Cont pulse ox 2. AKI on CKD stage 2-3 - 1. Daily labs 2. Intake and output 3. Hold lisinopril and lasix 4. IVF: 500 cc bolus in ED and will do another 1L at 100cc/hr overnight 3. DM2 - 1. Will reduce lantus slightly to 40u QHS (from 53 she takes at baseline) 2. Mod scale SSI AC 4. HTN - 1. Cont Amlodipine 2. Holding lisinopril and lasix due to AKI  DVT prophylaxis: Lovenox Code Status: Full Family Communication: No family in room Disposition Plan: ALF after admit Consults called: None Admission status: Admit to inpatient  Severity of Illness: The appropriate patient status for this  patient is INPATIENT. Inpatient status is judged to be reasonable and necessary in order to provide the required intensity of service to ensure the patient's safety. The patient's presenting symptoms, physical exam findings, and initial radiographic and laboratory data in the context of their chronic comorbidities is felt to place them at high risk for further clinical deterioration. Furthermore, it is not anticipated that the patient will be medically stable  for discharge from the hospital within 2 midnights of admission. The following factors support the patient status of inpatient.   IP status due to new O2 requirement from COVID-19.  * I certify that at the point of admission it is my clinical judgment that the patient will require inpatient hospital care spanning beyond 2 midnights from the point of admission due to high intensity of service, high risk for further deterioration and high frequency of surveillance required.*    Lugene Hitt M. DO [facility]  How to contact the [facility] Attending or Consulting provider [place] or covering provider during after hours [place], for this patient?  1. Check the care team in [facility] and look for a) attending/consulting [facility] provider listed and b) the [facility] team listed 2. Log into [email or web address]  Amion Physician Scheduling and messaging for groups and whole hospitals  On call and physician scheduling software for group practices, residents, hospitalists and other medical providers for call, clinic, rotation and shift schedules. OnCall [employer] is a hospital-wide system for scheduling doctors and paging doctors on call. EasyPlot is for scientific plotting and data analysis.  [email or web address]  and use [facility]'s universal password to access. If you do not have the password, please contact the hospital operator.  3. Locate the [facility] provider you are looking for under [facility] and page to a number that you can be directly reached. 4. If you still  have difficulty reaching the provider, please page the [facility] (Director on Call) for the Hospitalists listed on amion for assistance.  10/29/2019, 9:23 PM

## 2019-10-29 NOTE — ED Notes (Signed)
Pt reports that she has since changed her name when she re-married & that now her name is Kathy Caldwell. Papers from the facility that she resides is at bedside & has her government name on them.

## 2019-10-29 NOTE — ED Notes (Signed)
Pt ambulated in room on room air. Pt O2 saturation maintained at 92% on ambulation. Pt is currently at 95% on room air.

## 2019-10-29 NOTE — ED Notes (Signed)
Spoke to [place] [facility] to update about pt.

## 2019-10-29 NOTE — ED Provider Notes (Signed)
[facility] EMERGENCY DEPARTMENT Provider Note   CSN: [id number] Arrival date & time: 10/29/19  1730     History No chief complaint on file.   Kathy Caldwell is a 74 y.o. female.  Patient with history of diabetes, hypertension presents to the emergency departmentvia EMS from [facility].  Reported positive test for Covid several days ago.  Patient was seen at [facility] in the emergency department there on 10/26/2019.  Patient had CT of the chest at that time which was negative for pneumonia.  She was not found to be hypoxic in the emergency department.  Patient states that she has gotten worse since that visit.  She states that she has loss of taste but can smell.  No fever.  She has a cough.  No nausea, vomiting, or diarrhea.  It was reported that the patient's oxygen saturations went to 84% on room air by EMS.  She improved to the mid 90s on 2 L nasal cannula.  She does not typically have an oxygen requirement.        Past Medical History:  Diagnosis Date  . Anxiety   . CKD (chronic kidney disease) stage 2, GFR 60-89 ml/min   . DM2 (diabetes mellitus, type 2) ([place])   . GERD (gastroesophageal reflux disease)   . HTN (hypertension)   . Hypothyroidism   . Osteoarthritis   . Peripheral neuropathy     Patient Active Problem List   Diagnosis Date Noted  . HTN (hypertension) 10/29/2019  . DM2 (diabetes mellitus, type 2) ([place]) 10/29/2019  . AKI (acute kidney injury) ([place]) 10/29/2019    The histories are not reviewed yet. Please review them in the "History" navigator section and refresh this [place].   OB History   No obstetric history on file.     Family History  Problem Relation Age of Onset  . Hypertension Mother   . Hypertension Father   . COPD Other     Social History   Tobacco Use  . Smoking status: Current Every Day Smoker  Substance Use Topics  . Alcohol use: Not Currently  . Drug use: Never    Home  Medications Prior to Admission medications   Not on File    Allergies    Patient has no allergy information on record.  Review of Systems   Review of Systems  Constitutional: Negative for chills and fever.  HENT: Negative for rhinorrhea and sore throat.   Eyes: Negative for redness.  Respiratory: Positive for cough and shortness of breath. Negative for wheezing.   Cardiovascular: Negative for chest pain.  Gastrointestinal: Negative for abdominal pain, diarrhea, nausea and vomiting.  Genitourinary: Negative for dysuria.  Musculoskeletal: Positive for myalgias.  Skin: Negative for rash.  Neurological: Negative for headaches.    Physical Exam Updated Vital Signs BP (!) 117/53 (BP Location: Right Arm)   Pulse 100   Temp 99.6 F (37.6 C) (Oral)   Resp 15   SpO2 97%   Physical Exam Vitals and nursing note reviewed.  Constitutional:      Appearance: She is well-developed.  HENT:     Head: Normocephalic and atraumatic.     Nose: Nose normal.     Mouth/Throat:     Mouth: Mucous membranes are moist.  Eyes:     General:        Right eye: No discharge.        Left eye: No discharge.     Conjunctiva/sclera: Conjunctivae normal.  Cardiovascular:     Rate and Rhythm: Normal rate and regular rhythm.     Heart sounds: Normal heart sounds.  Pulmonary:     Effort: Pulmonary effort is normal.     Breath sounds: Wheezing (mild, scattered) present.     Comments: Occasional cough during exam.  Otherwise, patient is speaking in full sentences in no distress. Abdominal:     Palpations: Abdomen is soft.     Tenderness: There is no abdominal tenderness. There is no guarding or rebound.  Musculoskeletal:     Cervical back: Normal range of motion and neck supple.  Skin:    General: Skin is warm and dry.  Neurological:     Mental Status: She is alert.     ED Results / Procedures / Treatments   Labs (all labs ordered are listed, but only abnormal results are displayed) Labs  Reviewed  CBC WITH DIFFERENTIAL/PLATELET - Abnormal; Notable for the following components:      Result Value   Hemoglobin 11.0 (*)    MCHC 29.3 (*)    All other components within normal limits  COMPREHENSIVE METABOLIC PANEL - Abnormal; Notable for the following components:   Sodium 131 (*)    CO2 20 (*)    Glucose, Bld 104 (*)    BUN 36 (*)    Creatinine, Ser 2.24 (*)    Calcium 8.2 (*)    Total Protein 5.8 (*)    Albumin 2.9 (*)    GFR calc non Af Amer 21 (*)    GFR calc Af Amer 24 (*)    All other components within normal limits  D-DIMER, QUANTITATIVE (NOT AT [facility]) - Abnormal; Notable for the following components:   D-Dimer, Quant 1.29 (*)    All other components within normal limits  LACTATE DEHYDROGENASE - Abnormal; Notable for the following components:   LDH 291 (*)    All other components within normal limits  FIBRINOGEN - Abnormal; Notable for the following components:   Fibrinogen 545 (*)    All other components within normal limits  C-REACTIVE PROTEIN - Abnormal; Notable for the following components:   CRP 5.5 (*)    All other components within normal limits  TRIGLYCERIDES - Abnormal; Notable for the following components:   Triglycerides 161 (*)    All other components within normal limits  POC SARS CORONAVIRUS 2 AG -  ED - Abnormal; Notable for the following components:   SARS Coronavirus 2 Ag POSITIVE (*)    All other components within normal limits  CULTURE, BLOOD (ROUTINE X 2)  CULTURE, BLOOD (ROUTINE X 2)  LACTIC ACID, PLASMA  FERRITIN  PROCALCITONIN  HEMOGLOBIN A1C   ED ECG REPORT   Date: 10/29/2019  Rate: 103  Rhythm: sinus tachycardia  QRS Axis: normal  Intervals: normal  ST/T Wave abnormalities: normal  Conduction Disutrbances:none  Narrative Interpretation:   Old EKG Reviewed: none available  I have personally reviewed the EKG tracing and agree with the computerized printout as noted.  Radiology DG Chest Port 1 View  Result Date:  10/29/2019 CLINICAL DATA:  Hypoxia. EXAM: PORTABLE CHEST 1 VIEW COMPARISON:  None. FINDINGS: Very mild atelectasis is seen within the left lung base. There is no evidence of acute infiltrate, pleural effusion or pneumothorax. The heart size and mediastinal contours are within normal limits. The visualized skeletal structures are unremarkable. IMPRESSION: 1. Very mild left basilar atelectasis. Electronically Signed   By: Virgina Norfolk M.D.   On: 10/29/2019 18:53  Procedures Procedures (including critical care time)  Medications Ordered in ED Medications  sodium chloride 0.9 % bolus 500 mL (has no administration in time range)  0.9 %  sodium chloride infusion (has no administration in time range)    ED Course  I have reviewed the triage vital signs and the nursing notes.  Pertinent labs & imaging results that were available during my care of the patient were reviewed by me and considered in my medical decision making (see chart for details).  Patient seen and examined.  Patient does seem a bit confused.  She states that her symptoms started 2 days ago, however we discussed her ED visit at [facility] 3 days ago.  She cannot tell me the exact onset of her symptoms.  Work-up initiated.  I reviewed work-up and imaging results from her ED visit 3 days ago.  Vital signs reviewed and are as follows: BP (!) 117/53 (BP Location: Right Arm)   Pulse 100   Temp 99.6 F (37.6 C) (Oral)   Resp 15   SpO2 97%   8:06 PM Reviewed labs to this point.   Creatinine was 1.62>>2.24 since 10/26/19. Other historical values 1.41 (12/2016), 1.35 (12/2015).   Sodium 135>>131. BUN 26>>36.   Pt has ambulated in room and maintained her O2 at 92%. However later she took herself off O2 and got up and was moving around in the room. When I saw her, RN was getting her back into bed and she was at 86% on RA with a good waveform. Pt agrees with admission.   8:50 PM COVID confirmed positive. Given SOB, weakness, AKI.  Will request admission.   9:14 PM Spoke with Dr. Alcario Drought who will see.     MDM Rules/Calculators/A&P                      Admit.   Final Clinical Impression(s) / ED Diagnoses Final diagnoses:  Acute kidney injury [facility])  Shortness of breath  COVID-19    Rx / DC Orders ED Discharge Orders    None       Carlisle Cater, Hershal Coria 10/29/19 2115    Maudie Flakes, MD 10/30/19 1530

## 2019-10-29 NOTE — ED Triage Notes (Signed)
Pt was tested positive for covid a few days ago at the facility she reside, her temperature has been 99 & was given po tylenol no O2 was given by her caretakers at the facility & EMS reports that they gave her 2L O2 because her sats was 84% on RA then increased to 94% on 2L.

## 2019-10-29 NOTE — ED Notes (Signed)
Pt removed self from monitor and got dressed in her clothes. Pt redirected back to bed and placed back on monitoring. Pt O2 at 88% on room air, pt placed back on 2L nasal cannula and O2 returned to 95%.

## 2019-10-30 LAB — PROCALCITONIN: Procalcitonin: 0.16 ng/mL

## 2019-10-30 LAB — CBC WITH DIFFERENTIAL/PLATELET
Abs Immature Granulocytes: 0.04 10*3/uL (ref 0.00–0.07)
Basophils Absolute: 0 10*3/uL (ref 0.0–0.1)
Basophils Relative: 0 %
Eosinophils Absolute: 0 10*3/uL (ref 0.0–0.5)
Eosinophils Relative: 0 %
HCT: 34.7 % — ABNORMAL LOW (ref 36.0–46.0)
Hemoglobin: 10.3 g/dL — ABNORMAL LOW (ref 12.0–15.0)
Immature Granulocytes: 1 %
Lymphocytes Relative: 4 %
Lymphs Abs: 0.2 10*3/uL — ABNORMAL LOW (ref 0.7–4.0)
MCH: 26.2 pg (ref 26.0–34.0)
MCHC: 29.7 g/dL — ABNORMAL LOW (ref 30.0–36.0)
MCV: 88.3 fL (ref 80.0–100.0)
Monocytes Absolute: 0.1 10*3/uL (ref 0.1–1.0)
Monocytes Relative: 2 %
Neutro Abs: 6.1 10*3/uL (ref 1.7–7.7)
Neutrophils Relative %: 93 %
Platelets: 202 10*3/uL (ref 150–400)
RBC: 3.93 MIL/uL (ref 3.87–5.11)
RDW: 14.6 % (ref 11.5–15.5)
WBC: 6.5 10*3/uL (ref 4.0–10.5)
nRBC: 0 % (ref 0.0–0.2)

## 2019-10-30 LAB — COMPREHENSIVE METABOLIC PANEL
ALT: 14 U/L (ref 0–44)
AST: 15 U/L (ref 15–41)
Albumin: 2.7 g/dL — ABNORMAL LOW (ref 3.5–5.0)
Alkaline Phosphatase: 71 U/L (ref 38–126)
Anion gap: 10 (ref 5–15)
BUN: 31 mg/dL — ABNORMAL HIGH (ref 8–23)
CO2: 20 mmol/L — ABNORMAL LOW (ref 22–32)
Calcium: 8.1 mg/dL — ABNORMAL LOW (ref 8.9–10.3)
Chloride: 102 mmol/L (ref 98–111)
Creatinine, Ser: 1.86 mg/dL — ABNORMAL HIGH (ref 0.44–1.00)
GFR calc Af Amer: 31 mL/min — ABNORMAL LOW (ref >60)
GFR calc non Af Amer: 26 mL/min — ABNORMAL LOW (ref >60)
Glucose, Bld: 218 mg/dL — ABNORMAL HIGH (ref 70–99)
Potassium: 4.6 mmol/L (ref 3.5–5.1)
Sodium: 132 mmol/L — ABNORMAL LOW (ref 135–145)
Total Bilirubin: 0.3 mg/dL (ref 0.3–1.2)
Total Protein: 5.5 g/dL — ABNORMAL LOW (ref 6.5–8.1)

## 2019-10-30 LAB — CBG MONITORING, ED
Glucose-Capillary: 176 mg/dL — ABNORMAL HIGH (ref 70–99)
Glucose-Capillary: 192 mg/dL — ABNORMAL HIGH (ref 70–99)
Glucose-Capillary: 250 mg/dL — ABNORMAL HIGH (ref 70–99)

## 2019-10-30 LAB — D-DIMER, QUANTITATIVE: D-Dimer, Quant: 1.37 ug/mL-FEU — ABNORMAL HIGH (ref 0.00–0.50)

## 2019-10-30 LAB — ABO/RH: ABO/RH(D): O POS

## 2019-10-30 LAB — C-REACTIVE PROTEIN: CRP: 6.4 mg/dL — ABNORMAL HIGH (ref <1.0)

## 2019-10-30 MED ORDER — MENTHOL 3 MG MT LOZG
1.0000 | LOZENGE | OROMUCOSAL | Status: DC | PRN
  Administered 2019-10-30 – 2019-11-04 (×4): 3 mg via ORAL
  Filled 2019-10-30: qty 9

## 2019-10-30 MED ORDER — CARVEDILOL 6.25 MG PO TABS
6.2500 mg | ORAL_TABLET | Freq: Two times a day (BID) | ORAL | Status: DC
  Administered 2019-10-31 – 2019-11-05 (×12): 6.25 mg via ORAL
  Filled 2019-10-30 (×12): qty 1

## 2019-10-30 NOTE — ED Notes (Signed)
MS   Breakfast ordered  

## 2019-10-30 NOTE — ED Notes (Signed)
Patient awake now admit doctor at bedside.

## 2019-10-30 NOTE — ED Notes (Signed)
Pt placed in hospital bed, comfort measures in place.

## 2019-10-30 NOTE — Progress Notes (Signed)
PROGRESS NOTE    Kathy Caldwell    Code Status: Full Code  [id number] DOB: 16-Jul-1946 DOA: 10/29/2019  PCP: Patient, No Pcp Per    Hospital Summary  This is a 74 year old female with history of type 2 diabetes, hypertension who tested positive for COVID-19 several days ago seen at [facility] regional on 1/5, CT chest negative and was not hypoxic then then having worsening symptoms.  Had loss of taste but not smell.  Also with cough.  Was satting 84% at an age and sent in by EMS on 1/8.  In ED satting 86% with ambulation on room air requiring 2 L nasal cannula for 90%.  A & P   Principal Problem:   Acute hypoxemic respiratory failure due to COVID-19 [facility]) Active Problems:   HTN (hypertension)   DM2 (diabetes mellitus, type 2) (HCC)   AKI (acute kidney injury) ([place])   Anxiety and depression   1. Acute hypoxic respiratory failure secondary to COVID-19 a. Currently requiring 4 L nasal cannula b. Day 2/5 remdesivir c. Day 2/10 Decadron d. Continue inhalers e. Continue to trend inflammatory markers 2. Type 2 diabetes with hyperglycemia in setting of steroids a. Continue Lantus 40 units nightly and sliding scale insulin 3 times daily with meals 3. AKI, unknown baseline a. Improved with IV fluids b. Continue to hold lisinopril and Lasix and monitor I's and O's 4. Hypertension a. Continue amlodipine b. We will add on Coreg for tachycardia and hypertension c. Hold lisinopril and Lasix as above  DVT prophylaxis: Lovenox  Family Communication: No family at bedside Disposition Plan: Discharge planning pending clinical stability, suspect discharge in 2 to 3 days  Consultants  None  Procedures  None  Antibiotics   Anti-infectives (From admission, onward)   Start     Dose/Rate Route Frequency Ordered Stop   10/30/19 2200  remdesivir 100 mg in sodium chloride 0.9 % 100 mL IVPB     100 mg 200 mL/hr over 30 Minutes Intravenous Daily 10/29/19 2115 11/03/19 2159   10/29/19 2130   remdesivir 200 mg in sodium chloride 0.9% 250 mL IVPB     200 mg 580 mL/hr over 30 Minutes Intravenous Once 10/29/19 2115 10/30/19 0004           Subjective   Patient evaluated at bedside in the ED in no acute distress resting comfortably.  Currently admits that she is feeling somewhat better.  Admits to wheezing and cough.  Denies much shortness of breath currently.  Otherwise denies any chest pain, nausea or vomiting.  No other complaints.  Objective   Vitals:   10/30/19 1045 10/30/19 1100 10/30/19 1200 10/30/19 1400  BP: 137/67 134/70 136/66 (!) 140/58  Pulse: (!) 113  (!) 112 (!) 115  Resp: 17 17 15  (!) 22  Temp:      TempSrc:      SpO2: 99% 100% 100% 95%  Weight:      Height:        Intake/Output Summary (Last 24 hours) at 10/30/2019 1830 Last data filed at 10/30/2019 0212 Gross per 24 hour  Intake 612.01 ml  Output --  Net 612.01 ml   Filed Weights   10/29/19 2200  Weight: 59 kg    Examination:  Physical Exam Vitals and nursing note reviewed.  Constitutional:      General: She is not in acute distress.    Appearance: She is ill-appearing.  HENT:     Head: Normocephalic and atraumatic.     Nose:  Comments: Wearing mask Cardiovascular:     Pulses: Normal pulses.  Pulmonary:     Effort: No respiratory distress.     Breath sounds: Wheezing present.  Abdominal:     General: Abdomen is flat.     Palpations: Abdomen is soft.  Musculoskeletal:        General: No swelling or tenderness.  Neurological:     Mental Status: She is alert. Mental status is at baseline.  Psychiatric:        Mood and Affect: Mood normal.        Behavior: Behavior normal.     Data Reviewed: I have personally reviewed following labs and imaging studies  CBC: Recent Labs  Lab 10/29/19 1904 10/30/19 0609  WBC 8.9 6.5  NEUTROABS 6.6 6.1  HGB 11.0* 10.3*  HCT 37.6 34.7*  MCV 90.2 88.3  PLT 244 [id number]   Basic Metabolic Panel: Recent Labs  Lab 10/29/19 1904  10/30/19 0609  NA 131* 132*  K 4.6 4.6  CL 101 102  CO2 20* 20*  GLUCOSE 104* 218*  BUN 36* 31*  CREATININE 2.24* 1.86*  CALCIUM 8.2* 8.1*   GFR: Estimated Creatinine Clearance: 25.1 mL/min (A) (by C-G formula based on SCr of 1.86 mg/dL (H)). Liver Function Tests: Recent Labs  Lab 10/29/19 1904 10/30/19 0609  AST 26 15  ALT 14 14  ALKPHOS 74 71  BILITOT 0.6 0.3  PROT 5.8* 5.5*  ALBUMIN 2.9* 2.7*   No results for input(s): LIPASE, AMYLASE in the last 168 hours. No results for input(s): AMMONIA in the last 168 hours. Coagulation Profile: No results for input(s): INR, PROTIME in the last 168 hours. Cardiac Enzymes: No results for input(s): CKTOTAL, CKMB, CKMBINDEX, TROPONINI in the last 168 hours. BNP (last 3 results) No results for input(s): PROBNP in the last 8760 hours. HbA1C: Recent Labs    10/29/19 2113  HGBA1C 8.3*   CBG: Recent Labs  Lab 10/29/19 2307 10/30/19 0838 10/30/19 1237 10/30/19 1713  GLUCAP 73 176* 192* 250*   Lipid Profile: Recent Labs    10/29/19 1904  TRIG 161*   Thyroid Function Tests: No results for input(s): TSH, T4TOTAL, FREET4, T3FREE, THYROIDAB in the last 72 hours. Anemia Panel: Recent Labs    10/29/19 1904  FERRITIN 23   Sepsis Labs: Recent Labs  Lab 10/29/19 1904 10/29/19 2003 10/30/19 0609  PROCALCITON  --  0.31 0.16  LATICACIDVEN 1.1  --   --     Recent Results (from the past 240 hour(s))  Blood Culture (routine x 2)     Status: None (Preliminary result)   Collection Time: 10/29/19  7:04 PM   Specimen: BLOOD LEFT HAND  Result Value Ref Range Status   Specimen Description BLOOD LEFT HAND  Final   Special Requests   Final    BOTTLES DRAWN AEROBIC AND ANAEROBIC Blood Culture adequate volume   Culture   Final    NO GROWTH < 12 HOURS Performed at [facility], [place] [address], [place], [place] [address]    Report Status PENDING  Incomplete  Blood Culture (routine x 2)     Status: None (Preliminary result)    Collection Time: 10/29/19  7:06 PM   Specimen: BLOOD LEFT FOREARM  Result Value Ref Range Status   Specimen Description BLOOD LEFT FOREARM  Final   Special Requests   Final    BOTTLES DRAWN AEROBIC AND ANAEROBIC Blood Culture adequate volume   Culture   Final  NO GROWTH < 12 HOURS Performed at [place] [address] [place], [place] [address]    Report Status PENDING  Incomplete         Radiology Studies: DG Chest Port 1 View  Result Date: 10/29/2019 CLINICAL DATA:  Hypoxia. EXAM: PORTABLE CHEST 1 VIEW COMPARISON:  None. FINDINGS: Very mild atelectasis is seen within the left lung base. There is no evidence of acute infiltrate, pleural effusion or pneumothorax. The heart size and mediastinal contours are within normal limits. The visualized skeletal structures are unremarkable. IMPRESSION: 1. Very mild left basilar atelectasis. Electronically Signed   By: Virgina Norfolk M.D.   On: 10/29/2019 18:53        Scheduled Meds: . albuterol  2 puff Inhalation Q6H  . amLODipine  10 mg Oral Daily  . ascorbic acid  500 mg Oral Daily  . aspirin EC  81 mg Oral Daily  . cholecalciferol  1,000 Units Oral Daily  . dexamethasone  6 mg Oral Q24H  . fluticasone  1 puff Inhalation BID  . heparin injection (subcutaneous)  5,000 Units Subcutaneous Q8H  . insulin aspart  0-15 Units Subcutaneous TID WC  . insulin glargine  40 Units Subcutaneous QHS  . lactobacillus acidophilus  1 tablet Oral Daily  . LORazepam  0.25 mg Oral Daily  . mirtazapine  45 mg Oral QHS  . pantoprazole  80 mg Oral Daily  . polyvinyl alcohol   Both Eyes BID  . risperiDONE  0.25 mg Oral QHS  . simvastatin  20 mg Oral QHS  . traZODone  100 mg Oral QHS  . venlafaxine XR  150 mg Oral Daily  . zinc sulfate  220 mg Oral Daily   Continuous Infusions: . remdesivir 100 mg in NS 100 mL       LOS: 1 day    Time spent: 26 minutes with over 50% of the time coordinating the patient's care    Harold Hedge, DO [facility] Pager [phone]  If 7PM-7AM, please contact night-coverage [email or web address] Password [email or web address] 10/30/2019, 6:30 PM

## 2019-10-30 NOTE — ED Notes (Signed)
Nurse at bedside patient awake alert answering and following commands appropriate. Patient does not remember earlier events. Patient said "I'm so sorry" Explain about inhaler and oral medication verbalized understanding and gave patient her food tray.

## 2019-10-30 NOTE — ED Notes (Signed)
Food tray brought to patient. Patient sleeping awakes by calling name loudly. Nurse spoke with patient patient attempts to go back to sleep. Called name again asked what patient's name is and patient showed middle finger to nurse. Explained the use of inhaler and need of her inhaler. Patient swat nurse hand away witnessed another nurse Cruzita Lederer.

## 2019-10-31 ENCOUNTER — Inpatient Hospital Stay (HOSPITAL_COMMUNITY): Payer: Medicare HMO

## 2019-10-31 LAB — COMPREHENSIVE METABOLIC PANEL
ALT: 13 U/L (ref 0–44)
AST: 16 U/L (ref 15–41)
Albumin: 2.8 g/dL — ABNORMAL LOW (ref 3.5–5.0)
Alkaline Phosphatase: 73 U/L (ref 38–126)
Anion gap: 10 (ref 5–15)
BUN: 30 mg/dL — ABNORMAL HIGH (ref 8–23)
CO2: 21 mmol/L — ABNORMAL LOW (ref 22–32)
Calcium: 8.4 mg/dL — ABNORMAL LOW (ref 8.9–10.3)
Chloride: 103 mmol/L (ref 98–111)
Creatinine, Ser: 1.48 mg/dL — ABNORMAL HIGH (ref 0.44–1.00)
GFR calc Af Amer: 40 mL/min — ABNORMAL LOW (ref >60)
GFR calc non Af Amer: 35 mL/min — ABNORMAL LOW (ref >60)
Glucose, Bld: 333 mg/dL — ABNORMAL HIGH (ref 70–99)
Potassium: 5 mmol/L (ref 3.5–5.1)
Sodium: 134 mmol/L — ABNORMAL LOW (ref 135–145)
Total Bilirubin: 0.2 mg/dL — ABNORMAL LOW (ref 0.3–1.2)
Total Protein: 5.7 g/dL — ABNORMAL LOW (ref 6.5–8.1)

## 2019-10-31 LAB — CBC WITH DIFFERENTIAL/PLATELET
Abs Immature Granulocytes: 0.04 10*3/uL (ref 0.00–0.07)
Basophils Absolute: 0 10*3/uL (ref 0.0–0.1)
Basophils Relative: 0 %
Eosinophils Absolute: 0 10*3/uL (ref 0.0–0.5)
Eosinophils Relative: 0 %
HCT: 35.6 % — ABNORMAL LOW (ref 36.0–46.0)
Hemoglobin: 10.5 g/dL — ABNORMAL LOW (ref 12.0–15.0)
Immature Granulocytes: 1 %
Lymphocytes Relative: 6 %
Lymphs Abs: 0.4 10*3/uL — ABNORMAL LOW (ref 0.7–4.0)
MCH: 25.8 pg — ABNORMAL LOW (ref 26.0–34.0)
MCHC: 29.5 g/dL — ABNORMAL LOW (ref 30.0–36.0)
MCV: 87.5 fL (ref 80.0–100.0)
Monocytes Absolute: 0.2 10*3/uL (ref 0.1–1.0)
Monocytes Relative: 3 %
Neutro Abs: 6.1 10*3/uL (ref 1.7–7.7)
Neutrophils Relative %: 90 %
Platelets: 235 10*3/uL (ref 150–400)
RBC: 4.07 MIL/uL (ref 3.87–5.11)
RDW: 14.6 % (ref 11.5–15.5)
WBC: 6.8 10*3/uL (ref 4.0–10.5)
nRBC: 0 % (ref 0.0–0.2)

## 2019-10-31 LAB — GLUCOSE, CAPILLARY
Glucose-Capillary: 222 mg/dL — ABNORMAL HIGH (ref 70–99)
Glucose-Capillary: 321 mg/dL — ABNORMAL HIGH (ref 70–99)
Glucose-Capillary: 227 mg/dL — ABNORMAL HIGH (ref 70–99)
Glucose-Capillary: 132 mg/dL — ABNORMAL HIGH (ref 70–99)
Glucose-Capillary: 100 mg/dL — ABNORMAL HIGH (ref 70–99)

## 2019-10-31 LAB — MAGNESIUM: Magnesium: 1.3 mg/dL — ABNORMAL LOW (ref 1.7–2.4)

## 2019-10-31 LAB — C-REACTIVE PROTEIN: CRP: 3.9 mg/dL — ABNORMAL HIGH (ref <1.0)

## 2019-10-31 LAB — D-DIMER, QUANTITATIVE: D-Dimer, Quant: 1.23 ug/mL-FEU — ABNORMAL HIGH (ref 0.00–0.50)

## 2019-10-31 MED ORDER — AZITHROMYCIN 500 MG PO TABS
500.0000 mg | ORAL_TABLET | Freq: Every day | ORAL | Status: AC
  Administered 2019-10-31: 500 mg via ORAL
  Filled 2019-10-31: qty 1

## 2019-10-31 MED ORDER — MAGNESIUM SULFATE 4 GM/100ML IV SOLN
4.0000 g | Freq: Once | INTRAVENOUS | Status: AC
  Administered 2019-11-01: 4 g via INTRAVENOUS
  Filled 2019-10-31 (×2): qty 100

## 2019-10-31 MED ORDER — AZITHROMYCIN 500 MG PO TABS
250.0000 mg | ORAL_TABLET | Freq: Every day | ORAL | Status: AC
  Administered 2019-11-01 – 2019-11-04 (×4): 250 mg via ORAL
  Filled 2019-10-31 (×4): qty 1

## 2019-10-31 MED ORDER — IPRATROPIUM-ALBUTEROL 20-100 MCG/ACT IN AERS
1.0000 | INHALATION_SPRAY | Freq: Four times a day (QID) | RESPIRATORY_TRACT | Status: DC
  Administered 2019-10-31 – 2019-11-04 (×14): 1 via RESPIRATORY_TRACT
  Filled 2019-10-31: qty 4

## 2019-10-31 MED ORDER — ALBUTEROL SULFATE HFA 108 (90 BASE) MCG/ACT IN AERS: 2.0000 | INHALATION_SPRAY | RESPIRATORY_TRACT | Status: DC | PRN

## 2019-10-31 MED ORDER — INSULIN ASPART 100 UNIT/ML ~~LOC~~ SOLN
5.0000 [IU] | Freq: Three times a day (TID) | SUBCUTANEOUS | Status: DC
  Administered 2019-10-31 – 2019-11-05 (×14): 5 [IU] via SUBCUTANEOUS

## 2019-10-31 NOTE — Progress Notes (Signed)
PROGRESS NOTE    Kathy Caldwell    Code Status: Full Code  [id number] DOB: [birth year] DOA: 10/29/2019  PCP: Patient, No Pcp Per    Hospital Summary  This is a 74 year old female with history of type 2 diabetes, hypertension who tested positive for COVID-19 several days ago seen at [facility] regional on 1/5, CT chest negative and was not hypoxic then then having worsening symptoms.  Had loss of taste but not smell.  Also with cough.  Was satting 84% at an age and sent in by EMS on 1/8.  In ED satting 86% with ambulation on room air requiring 2 L nasal cannula for 90%.  A & P   Principal Problem:   Acute hypoxemic respiratory failure due to COVID-19 [facility]) Active Problems:   HTN (hypertension)   DM2 (diabetes mellitus, type 2) (HCC)   AKI (acute kidney injury) ([place])   Anxiety and depression   1. Acute hypoxic respiratory failure secondary to COVID-19 a. 4 L->3L/min nasal cannula, with very significant wheezing in hoarseness today b. Day 3/5 remdesivir c. Day 3/10 Decadron d. Repeat chest x-ray unremarkable e. Change Inhalers: Start Combivent standing, albuterol q6h PRN f. Continue to trend inflammatory markers 2. Type 2 diabetes with hyperglycemia in setting of steroids a. Continue Lantus 40 units nightly and sliding scale insulin 3 times daily with meals b. Add novolog 5 u three times daily with meals 3. AKI, unknown baseline a. Improved with IV fluids b. Continue to hold lisinopril and Lasix and monitor I's and O's 4. Hypertension a. Continue amlodipine b. Coreg for tachycardia and hypertension c. Hold lisinopril and Lasix as above 5. Suspect COPD exacerbation without history of diagnosed COPD but has history of tobacco use a. will add azithromycin atypical coverage  DVT prophylaxis: Lovenox Family Communication: number in file does not work Disposition Plan: Discharge planning pending clinical stability and PT/OT eval  Consultants  None  Procedures   None  Antibiotics   Anti-infectives (From admission, onward)   Start     Dose/Rate Route Frequency Ordered Stop   11/01/19 1000  azithromycin (ZITHROMAX) tablet 250 mg     250 mg Oral Daily 10/31/19 1538 11/05/19 0959   10/31/19 1545  azithromycin (ZITHROMAX) tablet 500 mg     500 mg Oral Daily 10/31/19 1538 11/01/19 0959   10/30/19 2200  remdesivir 100 mg in sodium chloride 0.9 % 100 mL IVPB     100 mg 200 mL/hr over 30 Minutes Intravenous Daily 10/29/19 2115 11/03/19 2159   10/29/19 2130  remdesivir 200 mg in sodium chloride 0.9% 250 mL IVPB     200 mg 580 mL/hr over 30 Minutes Intravenous Once 10/29/19 2115 10/30/19 0004           Subjective   Patient sitting upright in bed on nasal cannula.  Severe hoarseness and wheezing today though does not appear terribly uncomfortable.  Denies any chest pain, palpitations, lower extremity swelling.  Objective   Vitals:   10/31/19 0400 10/31/19 0653 10/31/19 0800 10/31/19 1431  BP: (!) 142/75  (!) 156/69 122/72  Pulse: (!) 111 (!) 109 (!) 107 94  Resp: 18 16 19 18   Temp:   98.2 F (36.8 C) 97.7 F (36.5 C)  TempSrc:   Oral Oral  SpO2: 90% 93% 91% 91%  Weight:      Height:        Intake/Output Summary (Last 24 hours) at 10/31/2019 1539 Last data filed at 10/31/2019 1350 Gross per 24 hour  Intake 0 ml  Output 1000 ml  Net -1000 ml   Filed Weights   10/29/19 2200  Weight: 59 kg    Examination:  Physical Exam Vitals and nursing note reviewed.  Constitutional:      General: She is not in acute distress.    Comments: Severe hoarseness when trying to speak  HENT:     Head: Normocephalic.  Cardiovascular:     Rate and Rhythm: Normal rate.  Pulmonary:     Breath sounds: Wheezing present.     Comments: Diffuse wheeze Cough Musculoskeletal:        General: No swelling or tenderness.  Skin:    Coloration: Skin is not jaundiced.  Neurological:     Mental Status: She is alert. Mental status is at baseline.   Psychiatric:        Mood and Affect: Mood normal.     Data Reviewed: I have personally reviewed following labs and imaging studies  CBC: Recent Labs  Lab 10/29/19 1904 10/30/19 0609 10/31/19 0500  WBC 8.9 6.5 6.8  NEUTROABS 6.6 6.1 6.1  HGB 11.0* 10.3* 10.5*  HCT 37.6 34.7* 35.6*  MCV 90.2 88.3 87.5  PLT 244 202 [id number]   Basic Metabolic Panel: Recent Labs  Lab 10/29/19 1904 10/30/19 0609 10/31/19 0500  NA 131* 132* 134*  K 4.6 4.6 5.0  CL 101 102 103  CO2 20* 20* 21*  GLUCOSE 104* 218* 333*  BUN 36* 31* 30*  CREATININE 2.24* 1.86* 1.48*  CALCIUM 8.2* 8.1* 8.4*  MG  --   --  1.3*   GFR: Estimated Creatinine Clearance: 31.5 mL/min (A) (by C-G formula based on SCr of 1.48 mg/dL (H)). Liver Function Tests: Recent Labs  Lab 10/29/19 1904 10/30/19 0609 10/31/19 0500  AST 26 15 16   ALT 14 14 13   ALKPHOS 74 71 73  BILITOT 0.6 0.3 0.2*  PROT 5.8* 5.5* 5.7*  ALBUMIN 2.9* 2.7* 2.8*   No results for input(s): LIPASE, AMYLASE in the last 168 hours. No results for input(s): AMMONIA in the last 168 hours. Coagulation Profile: No results for input(s): INR, PROTIME in the last 168 hours. Cardiac Enzymes: No results for input(s): CKTOTAL, CKMB, CKMBINDEX, TROPONINI in the last 168 hours. BNP (last 3 results) No results for input(s): PROBNP in the last 8760 hours. HbA1C: Recent Labs    10/29/19 2113  HGBA1C 8.3*   CBG: Recent Labs  Lab 10/30/19 1237 10/30/19 1713 10/31/19 0232 10/31/19 0813 10/31/19 1210  GLUCAP 192* 250* 222* 321* 227*   Lipid Profile: Recent Labs    10/29/19 1904  TRIG 161*   Thyroid Function Tests: No results for input(s): TSH, T4TOTAL, FREET4, T3FREE, THYROIDAB in the last 72 hours. Anemia Panel: Recent Labs    10/29/19 1904  FERRITIN 23   Sepsis Labs: Recent Labs  Lab 10/29/19 1904 10/29/19 2003 10/30/19 0609  PROCALCITON  --  0.31 0.16  LATICACIDVEN 1.1  --   --     Recent Results (from the past 240 hour(s))  Blood  Culture (routine x 2)     Status: None (Preliminary result)   Collection Time: 10/29/19  7:04 PM   Specimen: BLOOD LEFT HAND  Result Value Ref Range Status   Specimen Description BLOOD LEFT HAND  Final   Special Requests   Final    BOTTLES DRAWN AEROBIC AND ANAEROBIC Blood Culture adequate volume   Culture   Final    NO GROWTH 2 DAYS Performed at [facility],  [address] [place], [place] [address]    Report Status PENDING  Incomplete  Blood Culture (routine x 2)     Status: None (Preliminary result)   Collection Time: 10/29/19  7:06 PM   Specimen: BLOOD LEFT FOREARM  Result Value Ref Range Status   Specimen Description BLOOD LEFT FOREARM  Final   Special Requests   Final    BOTTLES DRAWN AEROBIC AND ANAEROBIC Blood Culture adequate volume   Culture   Final    NO GROWTH 2 DAYS Performed at [facility], [place] [address], [place], [place] [address]    Report Status PENDING  Incomplete         Radiology Studies: DG CHEST PORT 1 VIEW  Result Date: 10/31/2019 CLINICAL DATA:  Shortness of breath. EXAM: PORTABLE CHEST 1 VIEW COMPARISON:  Chest x-rays dated 10/29/2019 and 10/26/2019 and chest CT dated 10/26/2019 FINDINGS: The heart size and mediastinal contours are within normal limits. Aortic atherosclerosis. No effusions. Both lungs are clear. The visualized skeletal structures are unremarkable. IMPRESSION: 1. No active cardiopulmonary disease. 2.  Aortic Atherosclerosis (ICD10-I70.0). Electronically Signed   By: Lorriane Shire M.D.   On: 10/31/2019 11:39   DG Chest Port 1 View  Result Date: 10/29/2019 CLINICAL DATA:  Hypoxia. EXAM: PORTABLE CHEST 1 VIEW COMPARISON:  None. FINDINGS: Very mild atelectasis is seen within the left lung base. There is no evidence of acute infiltrate, pleural effusion or pneumothorax. The heart size and mediastinal contours are within normal limits. The visualized skeletal structures are unremarkable. IMPRESSION: 1. Very mild left basilar  atelectasis. Electronically Signed   By: Virgina Norfolk M.D.   On: 10/29/2019 18:53        Scheduled Meds: . amLODipine  10 mg Oral Daily  . ascorbic acid  500 mg Oral Daily  . aspirin EC  81 mg Oral Daily  . azithromycin  500 mg Oral Daily   Followed by  . [START ON 11/01/2019] azithromycin  250 mg Oral Daily  . carvedilol  6.25 mg Oral BID WC  . cholecalciferol  1,000 Units Oral Daily  . dexamethasone  6 mg Oral Q24H  . fluticasone  1 puff Inhalation BID  . heparin injection (subcutaneous)  5,000 Units Subcutaneous Q8H  . insulin aspart  0-15 Units Subcutaneous TID WC  . insulin aspart  5 Units Subcutaneous TID WC  . insulin glargine  40 Units Subcutaneous QHS  . Ipratropium-Albuterol  1 puff Inhalation Q6H  . lactobacillus acidophilus  1 tablet Oral Daily  . LORazepam  0.25 mg Oral Daily  . mirtazapine  45 mg Oral QHS  . pantoprazole  80 mg Oral Daily  . polyvinyl alcohol   Both Eyes BID  . risperiDONE  0.25 mg Oral QHS  . simvastatin  20 mg Oral QHS  . traZODone  100 mg Oral QHS  . venlafaxine XR  150 mg Oral Daily  . zinc sulfate  220 mg Oral Daily   Continuous Infusions: . remdesivir 100 mg in NS 100 mL Stopped (10/30/19 2234)     LOS: 2 days    Time spent: 25 minutes with over 50% of the time coordinating the patient's care    Harold Hedge, DO [facility] Pager [phone]  If 7PM-7AM, please contact night-coverage [email or web address] Password [facility] 10/31/2019, 3:39 PM

## 2019-10-31 NOTE — Progress Notes (Signed)
Patient arrived on the unit via ED transport. Orientation x1-2. O2 On 3L [place]. Some audible wheezes noted; see assessment. Skin assessment completed. Cardiac telemetry placed, also continuous pulse oximetry. Patient is laying in a lot of stool, but otherwise no s/s distress noted. Bath given, assessment complete.

## 2019-11-01 LAB — CBC WITH DIFFERENTIAL/PLATELET
Abs Immature Granulocytes: 0.1 10*3/uL — ABNORMAL HIGH (ref 0.00–0.07)
Basophils Absolute: 0 10*3/uL (ref 0.0–0.1)
Basophils Relative: 0 %
Eosinophils Absolute: 0 10*3/uL (ref 0.0–0.5)
Eosinophils Relative: 0 %
HCT: 34.5 % — ABNORMAL LOW (ref 36.0–46.0)
Hemoglobin: 10.6 g/dL — ABNORMAL LOW (ref 12.0–15.0)
Immature Granulocytes: 1 %
Lymphocytes Relative: 4 %
Lymphs Abs: 0.5 10*3/uL — ABNORMAL LOW (ref 0.7–4.0)
MCH: 26.2 pg (ref 26.0–34.0)
MCHC: 30.7 g/dL (ref 30.0–36.0)
MCV: 85.2 fL (ref 80.0–100.0)
Monocytes Absolute: 0.5 10*3/uL (ref 0.1–1.0)
Monocytes Relative: 4 %
Neutro Abs: 12.7 10*3/uL — ABNORMAL HIGH (ref 1.7–7.7)
Neutrophils Relative %: 91 %
Platelets: 248 10*3/uL (ref 150–400)
RBC: 4.05 MIL/uL (ref 3.87–5.11)
RDW: 14.6 % (ref 11.5–15.5)
WBC: 13.9 10*3/uL — ABNORMAL HIGH (ref 4.0–10.5)
nRBC: 0 % (ref 0.0–0.2)

## 2019-11-01 LAB — COMPREHENSIVE METABOLIC PANEL
ALT: 15 U/L (ref 0–44)
AST: 20 U/L (ref 15–41)
Albumin: 2.7 g/dL — ABNORMAL LOW (ref 3.5–5.0)
Alkaline Phosphatase: 82 U/L (ref 38–126)
Anion gap: 10 (ref 5–15)
BUN: 38 mg/dL — ABNORMAL HIGH (ref 8–23)
CO2: 21 mmol/L — ABNORMAL LOW (ref 22–32)
Calcium: 8.5 mg/dL — ABNORMAL LOW (ref 8.9–10.3)
Chloride: 106 mmol/L (ref 98–111)
Creatinine, Ser: 1.42 mg/dL — ABNORMAL HIGH (ref 0.44–1.00)
GFR calc Af Amer: 42 mL/min — ABNORMAL LOW (ref >60)
GFR calc non Af Amer: 37 mL/min — ABNORMAL LOW (ref >60)
Glucose, Bld: 222 mg/dL — ABNORMAL HIGH (ref 70–99)
Potassium: 4.6 mmol/L (ref 3.5–5.1)
Sodium: 137 mmol/L (ref 135–145)
Total Bilirubin: 0.3 mg/dL (ref 0.3–1.2)
Total Protein: 5.7 g/dL — ABNORMAL LOW (ref 6.5–8.1)

## 2019-11-01 LAB — D-DIMER, QUANTITATIVE: D-Dimer, Quant: 1.07 ug/mL-FEU — ABNORMAL HIGH (ref 0.00–0.50)

## 2019-11-01 LAB — C-REACTIVE PROTEIN: CRP: 2.6 mg/dL — ABNORMAL HIGH (ref <1.0)

## 2019-11-01 LAB — GLUCOSE, CAPILLARY
Glucose-Capillary: 194 mg/dL — ABNORMAL HIGH (ref 70–99)
Glucose-Capillary: 202 mg/dL — ABNORMAL HIGH (ref 70–99)
Glucose-Capillary: 190 mg/dL — ABNORMAL HIGH (ref 70–99)
Glucose-Capillary: 195 mg/dL — ABNORMAL HIGH (ref 70–99)

## 2019-11-01 NOTE — Evaluation (Signed)
Physical Therapy Evaluation Patient Details Name: Kathy Caldwell MRN: [id number] DOB: 11-04-1945 Today's Date: 11/01/2019   History of Present Illness  74 y.o. female with medical history significant of DM2, HTN.  Patient tested positive for COVID several days ago at [facility].  Patient seen at [facility] on 1/5, CT chest neg and not hypoxic then. EMS brought from [facility] to ED with hypoxia. In ED satting 86% with ambulation on room air requiring 2 L nasal cannula for 90%. Admitted 10/29/19 for treatment of COVID.  Clinical Impression  PTA pt resident of [facility] where she ambulated without AD in her room and used RW for ambulation in the facility. Facility provides meals and assist with bathing and dressing. Pt is currently limited in safe mobility by increased O2 demand and dizziness in presence of generalized weakness and decreased balance. Pt is min A for bed mobility, min guard for transfers and minA for ambulation of 3 feet from bed to recliner. Pt with c/o dizziness throughout however BP in line with level prior to mobility. PT recommending HHPT in her ALF for improving strength and endurance. PT will continue to follow acutely.    Follow Up Recommendations Other (comment)(return to [facility])    Equipment Recommendations  None recommended by PT    Recommendations for Other Services OT consult     Precautions / Restrictions Precautions Precautions: Fall Restrictions Weight Bearing Restrictions: No      Mobility  Bed Mobility Overal bed mobility: Needs Assistance Bed Mobility: Supine to Sit     Supine to sit: Min assist     General bed mobility comments: pt able to come to longsitting in bed without assist and as therapist fastening her gown she leaned back against the pillow, when asked pt states why she leaned back reports some dizziness, with second attempt to come to EoB, pt requires minA for pulling against therapist to  bring hips to EoB  Transfers Overall transfer level: Needs assistance   Transfers: Sit to/from Stand Sit to Stand: Min guard         General transfer comment: min guard for safety, good power up, increased effort to steady   Ambulation/Gait Ambulation/Gait assistance: Min assist Gait Distance (Feet): 3 Feet Assistive device: Rolling walker (2 wheeled) Gait Pattern/deviations: Step-through pattern;Decreased step length - right;Decreased step length - left;Shuffle Gait velocity: slowed Gait velocity interpretation: <1.31 ft/sec, indicative of household ambulator General Gait Details: min A for steadying with steps from bed to recliner     Balance Overall balance assessment: Needs assistance Sitting-balance support: Feet unsupported;Feet supported;No upper extremity supported Sitting balance-Leahy Scale: Good     Standing balance support: During functional activity;Bilateral upper extremity supported Standing balance-Leahy Scale: Poor Standing balance comment: requires RW assist                             Pertinent Vitals/Pain Pain Assessment: 0-10 Pain Score: 3  Pain Location: low back  Pain Descriptors / Indicators: Aching;Sore Pain Intervention(s): Limited activity within patient's tolerance;Monitored during session;Repositioned    Home Living Family/patient expects to be discharged to:: Assisted living               Home Equipment: Walker - 2 wheels      Prior Function Level of Independence: Needs assistance   Gait / Transfers Assistance Needed: ambulates with a RW sometimes  ADL's / Homemaking Assistance Needed: facility provides meals, and assist  with bathing and dressing           Extremity/Trunk Assessment   Upper Extremity Assessment Upper Extremity Assessment: Generalized weakness    Lower Extremity Assessment Lower Extremity Assessment: Generalized weakness    Cervical / Trunk Assessment Cervical / Trunk Assessment:  Kyphotic  Communication   Communication: HOH  Cognition Arousal/Alertness: Awake/alert Behavior During Therapy: WFL for tasks assessed/performed Overall Cognitive Status: Within Functional Limits for tasks assessed Area of Impairment: Problem solving;Following commands                       Following Commands: Follows multi-step commands with increased time     Problem Solving: Requires verbal cues;Slow processing General Comments: requires more time for processing and verbal cues for task completion       General Comments General comments (skin integrity, edema, etc.): Pt on 5L O2 via [place] on entry and able to maintain SaO2 >93%O2 throughout session, pt with dizziness in seated and increased dizziness in standing preventing long distance ambulation, sitting BP 150/89 and in line with previous levels.         Assessment/Plan    PT Assessment Patient needs continued PT services  PT Problem List Decreased strength;Decreased activity tolerance;Decreased balance;Decreased mobility;Decreased cognition;Decreased safety awareness       PT Treatment Interventions DME instruction;Gait training;Stair training;Functional mobility training;Therapeutic activities;Therapeutic exercise;Balance training;Cognitive remediation;Patient/family education    PT Goals (Current goals can be found in the Care Plan section)  Acute Rehab PT Goals Patient Stated Goal: get back to doing things at her facility PT Goal Formulation: With patient Time For Goal Achievement: 11/15/19 Potential to Achieve Goals: Good    Frequency Min 3X/week    AM-PAC PT "6 Clicks" Mobility  Outcome Measure Help needed turning from your back to your side while in a flat bed without using bedrails?: None Help needed moving from lying on your back to sitting on the side of a flat bed without using bedrails?: A Little Help needed moving to and from a bed to a chair (including a wheelchair)?: A Little Help needed  standing up from a chair using your arms (e.g., wheelchair or bedside chair)?: A Little Help needed to walk in hospital room?: A Little Help needed climbing 3-5 steps with a railing? : A Lot 6 Click Score: 18    End of Session Equipment Utilized During Treatment: Gait belt;Oxygen Activity Tolerance: Other (comment)(limited by slight dizziness) Patient left: in chair;with call bell/phone within reach;with chair alarm set Nurse Communication: Mobility status PT Visit Diagnosis: Unsteadiness on feet (R26.81);Other abnormalities of gait and mobility (R26.89);Muscle weakness (generalized) (M62.81);Dizziness and giddiness (R42)    Time: [id number] PT Time Calculation (min) (ACUTE ONLY): 23 min   Charges:   PT Evaluation $PT Eval Moderate Complexity: 1 Mod PT Treatments $Gait Training: 8-22 mins        Cornie Herrington B. Migdalia Dk PT, DPT Acute Rehabilitation Services Pager [phone] Office [phone]   [place] 11/01/2019, 10:42 AM

## 2019-11-01 NOTE — Progress Notes (Signed)
PROGRESS NOTE    Kathy Caldwell    Code Status: Full Code  [id number] DOB: January 24, 1946 DOA: 10/29/2019  PCP: Patient, No Pcp Per    Hospital Summary  This is a 74 year old female with history of type 2 diabetes, hypertension who tested positive for COVID-19 several days ago seen at [facility] on 1/5, CT chest negative and was not hypoxic then then having worsening symptoms.  Had loss of taste but not smell.  Also with cough.  Was satting 84% at an age and sent in by EMS on 1/8.  In ED satting 86% with ambulation on room air requiring 2 L nasal cannula for 90%.  A & P   Principal Problem:   Acute hypoxemic respiratory failure due to COVID-19 [facility]) Active Problems:   HTN (hypertension)   DM2 (diabetes mellitus, type 2) (HCC)   AKI (acute kidney injury) ([place])   Anxiety and depression   1. Acute hypoxic respiratory failure secondary to COVID-19 a. 4 L->3-> 6L/min nasal cannula, with improved wheezing b. Day 4/5 remdesivir c. Day 4/10 Decadron d. Combivent standing, albuterol q6h PRN e. Continue to trend inflammatory markers f. Consider restarting lasix, follow up weight 2. Type 2 diabetes with hyperglycemia in setting of steroids a. Continue Lantus 40 units nightly and sliding scale insulin 3 times daily with meals b. Add novolog 5 u three times daily with meals 3. AKI, unknown baseline a. Improved with IV fluids b. Continue to hold lisinopril and Lasix and monitor I's and O's 4. Hypertension a. Continue amlodipine b. Coreg for tachycardia and hypertension c. Hold lisinopril and Lasix as above d. Check daily weight while off lasix, may need to restart 5. Suspect COPD exacerbation without history of diagnosed COPD but has history of tobacco use a. Continue azithromycin atypical coverage  DVT prophylaxis: Lovenox Family Communication: number in file does not work Disposition Plan: Continue to follow with SW and PT regarding [facility]  Consultants   None  Procedures  None  Antibiotics   Anti-infectives (From admission, onward)   Start     Dose/Rate Route Frequency Ordered Stop   11/01/19 1600  azithromycin (ZITHROMAX) tablet 250 mg     250 mg Oral Daily 10/31/19 1538 11/05/19 0959   10/31/19 1600  azithromycin (ZITHROMAX) tablet 500 mg     500 mg Oral Daily 10/31/19 1538 10/31/19 1737   10/30/19 2200  remdesivir 100 mg in sodium chloride 0.9 % 100 mL IVPB     100 mg 200 mL/hr over 30 Minutes Intravenous Daily 10/29/19 2115 11/03/19 2159   10/29/19 2130  remdesivir 200 mg in sodium chloride 0.9% 250 mL IVPB     200 mg 580 mL/hr over 30 Minutes Intravenous Once 10/29/19 2115 10/30/19 0004           Subjective   Patient sitting upright in bedside chair resting comfortably.  States she is feeling better today compared to yesterday.  Still with some hoarseness but this is improved.  States improved wheezing.  Denies any complaints at this time.  Objective   Vitals:   11/01/19 0000 11/01/19 0054 11/01/19 0800 11/01/19 1225  BP: 129/79  (!) 148/81 (!) 153/84  Pulse:  97 100 93  Resp:  18 16 16   Temp:    98.3 F (36.8 C)  TempSrc:    Oral  SpO2:  91% 92% 92%  Weight:      Height:        Intake/Output Summary (Last 24 hours) at 11/01/2019  [place] filed at 11/01/2019 0930 Gross per 24 hour  Intake 60 ml  Output 1100 ml  Net -1040 ml   Filed Weights   10/29/19 2200  Weight: 59 kg    Examination:  Physical Exam Vitals and nursing note reviewed.  Constitutional:      Comments: Improved hoarseness  Cardiovascular:     Rate and Rhythm: Normal rate.  Pulmonary:     Comments: Coughing, improved wheezing Abdominal:     General: Abdomen is flat. There is no distension.  Musculoskeletal:        General: No swelling or tenderness.  Neurological:     Mental Status: She is alert. Mental status is at baseline.  Psychiatric:        Mood and Affect: Mood normal.     Data Reviewed: I have personally  reviewed following labs and imaging studies  CBC: Recent Labs  Lab 10/29/19 1904 10/30/19 0609 10/31/19 0500 11/01/19 0816  WBC 8.9 6.5 6.8 13.9*  NEUTROABS 6.6 6.1 6.1 12.7*  HGB 11.0* 10.3* 10.5* 10.6*  HCT 37.6 34.7* 35.6* 34.5*  MCV 90.2 88.3 87.5 85.2  PLT 244 202 235 [id number]   Basic Metabolic Panel: Recent Labs  Lab 10/29/19 1904 10/30/19 0609 10/31/19 0500 11/01/19 0816  NA 131* 132* 134* 137  K 4.6 4.6 5.0 4.6  CL 101 102 103 106  CO2 20* 20* 21* 21*  GLUCOSE 104* 218* 333* 222*  BUN 36* 31* 30* 38*  CREATININE 2.24* 1.86* 1.48* 1.42*  CALCIUM 8.2* 8.1* 8.4* 8.5*  MG  --   --  1.3*  --    GFR: Estimated Creatinine Clearance: 32.9 mL/min (A) (by C-G formula based on SCr of 1.42 mg/dL (H)). Liver Function Tests: Recent Labs  Lab 10/29/19 1904 10/30/19 0609 10/31/19 0500 11/01/19 0816  AST 26 15 16 20   ALT 14 14 13 15   ALKPHOS 74 71 73 82  BILITOT 0.6 0.3 0.2* 0.3  PROT 5.8* 5.5* 5.7* 5.7*  ALBUMIN 2.9* 2.7* 2.8* 2.7*   No results for input(s): LIPASE, AMYLASE in the last 168 hours. No results for input(s): AMMONIA in the last 168 hours. Coagulation Profile: No results for input(s): INR, PROTIME in the last 168 hours. Cardiac Enzymes: No results for input(s): CKTOTAL, CKMB, CKMBINDEX, TROPONINI in the last 168 hours. BNP (last 3 results) No results for input(s): PROBNP in the last 8760 hours. HbA1C: Recent Labs    10/29/19 2113  HGBA1C 8.3*   CBG: Recent Labs  Lab 10/31/19 1210 10/31/19 1641 10/31/19 2054 11/01/19 0818 11/01/19 1228  GLUCAP 227* 132* 100* 194* 202*   Lipid Profile: Recent Labs    10/29/19 1904  TRIG 161*   Thyroid Function Tests: No results for input(s): TSH, T4TOTAL, FREET4, T3FREE, THYROIDAB in the last 72 hours. Anemia Panel: Recent Labs    10/29/19 1904  FERRITIN 23   Sepsis Labs: Recent Labs  Lab 10/29/19 1904 10/29/19 2003 10/30/19 0609  PROCALCITON  --  0.31 0.16  LATICACIDVEN 1.1  --   --      Recent Results (from the past 240 hour(s))  Blood Culture (routine x 2)     Status: None (Preliminary result)   Collection Time: 10/29/19  7:04 PM   Specimen: BLOOD LEFT HAND  Result Value Ref Range Status   Specimen Description BLOOD LEFT HAND  Final   Special Requests   Final    BOTTLES DRAWN AEROBIC AND ANAEROBIC Blood Culture adequate volume   Culture  Final    NO GROWTH 3 DAYS Performed at [facility], [place] [address], [place], [place] [address]    Report Status PENDING  Incomplete  Blood Culture (routine x 2)     Status: None (Preliminary result)   Collection Time: 10/29/19  7:06 PM   Specimen: BLOOD LEFT FOREARM  Result Value Ref Range Status   Specimen Description BLOOD LEFT FOREARM  Final   Special Requests   Final    BOTTLES DRAWN AEROBIC AND ANAEROBIC Blood Culture adequate volume   Culture   Final    NO GROWTH 3 DAYS Performed at [facility], [place] [address], [place], [place] [address]    Report Status PENDING  Incomplete         Radiology Studies: DG CHEST PORT 1 VIEW  Result Date: 10/31/2019 CLINICAL DATA:  Shortness of breath. EXAM: PORTABLE CHEST 1 VIEW COMPARISON:  Chest x-rays dated 10/29/2019 and 10/26/2019 and chest CT dated 10/26/2019 FINDINGS: The heart size and mediastinal contours are within normal limits. Aortic atherosclerosis. No effusions. Both lungs are clear. The visualized skeletal structures are unremarkable. IMPRESSION: 1. No active cardiopulmonary disease. 2.  Aortic Atherosclerosis (ICD10-I70.0). Electronically Signed   By: Lorriane Shire M.D.   On: 10/31/2019 11:39        Scheduled Meds: . amLODipine  10 mg Oral Daily  . ascorbic acid  500 mg Oral Daily  . aspirin EC  81 mg Oral Daily  . azithromycin  250 mg Oral Daily  . carvedilol  6.25 mg Oral BID WC  . cholecalciferol  1,000 Units Oral Daily  . dexamethasone  6 mg Oral Q24H  . fluticasone  1 puff Inhalation BID  . heparin injection (subcutaneous)  5,000 Units  Subcutaneous Q8H  . insulin aspart  0-15 Units Subcutaneous TID WC  . insulin aspart  5 Units Subcutaneous TID WC  . insulin glargine  40 Units Subcutaneous QHS  . Ipratropium-Albuterol  1 puff Inhalation Q6H  . lactobacillus acidophilus  1 tablet Oral Daily  . LORazepam  0.25 mg Oral Daily  . mirtazapine  45 mg Oral QHS  . pantoprazole  80 mg Oral Daily  . polyvinyl alcohol   Both Eyes BID  . risperiDONE  0.25 mg Oral QHS  . simvastatin  20 mg Oral QHS  . traZODone  100 mg Oral QHS  . venlafaxine XR  150 mg Oral Daily  . zinc sulfate  220 mg Oral Daily   Continuous Infusions: . remdesivir 100 mg in NS 100 mL 100 mg (10/31/19 2208)     LOS: 3 days    Time spent: 26 minutes with over 50% of the time coordinating the patient's care    Harold Hedge, DO [facility] Pager [phone]  If 7PM-7AM, please contact night-coverage [email or web address] Password [facility] 11/01/2019, 2:29 PM

## 2019-11-01 NOTE — Progress Notes (Addendum)
1:48pm-CSW awaiting call back from [facility].   3pm-Per [facility], their maximum oxygen capability is 4L, and that is normally for people on hospice. They are requesting patient try to wean from her current 6L O2.   Percell Locus Loyd Salvador LCSW [phone]

## 2019-11-02 LAB — BASIC METABOLIC PANEL
Anion gap: 7 (ref 5–15)
BUN: 36 mg/dL — ABNORMAL HIGH (ref 8–23)
CO2: 24 mmol/L (ref 22–32)
Calcium: 8.4 mg/dL — ABNORMAL LOW (ref 8.9–10.3)
Chloride: 106 mmol/L (ref 98–111)
Creatinine, Ser: 1.35 mg/dL — ABNORMAL HIGH (ref 0.44–1.00)
GFR calc Af Amer: 45 mL/min — ABNORMAL LOW (ref >60)
GFR calc non Af Amer: 39 mL/min — ABNORMAL LOW (ref >60)
Glucose, Bld: 87 mg/dL (ref 70–99)
Potassium: 4.5 mmol/L (ref 3.5–5.1)
Sodium: 137 mmol/L (ref 135–145)

## 2019-11-02 LAB — CBC WITH DIFFERENTIAL/PLATELET
Abs Immature Granulocytes: 0.17 10*3/uL — ABNORMAL HIGH (ref 0.00–0.07)
Basophils Absolute: 0 10*3/uL (ref 0.0–0.1)
Basophils Relative: 0 %
Eosinophils Absolute: 0 10*3/uL (ref 0.0–0.5)
Eosinophils Relative: 0 %
HCT: 35.8 % — ABNORMAL LOW (ref 36.0–46.0)
Hemoglobin: 10.8 g/dL — ABNORMAL LOW (ref 12.0–15.0)
Immature Granulocytes: 2 %
Lymphocytes Relative: 7 %
Lymphs Abs: 0.7 10*3/uL (ref 0.7–4.0)
MCH: 25.8 pg — ABNORMAL LOW (ref 26.0–34.0)
MCHC: 30.2 g/dL (ref 30.0–36.0)
MCV: 85.6 fL (ref 80.0–100.0)
Monocytes Absolute: 0.4 10*3/uL (ref 0.1–1.0)
Monocytes Relative: 4 %
Neutro Abs: 9.1 10*3/uL — ABNORMAL HIGH (ref 1.7–7.7)
Neutrophils Relative %: 87 %
Platelets: 268 10*3/uL (ref 150–400)
RBC: 4.18 MIL/uL (ref 3.87–5.11)
RDW: 14.6 % (ref 11.5–15.5)
WBC: 10.4 10*3/uL (ref 4.0–10.5)
nRBC: 0 % (ref 0.0–0.2)

## 2019-11-02 LAB — GLUCOSE, CAPILLARY
Glucose-Capillary: 90 mg/dL (ref 70–99)
Glucose-Capillary: 109 mg/dL — ABNORMAL HIGH (ref 70–99)
Glucose-Capillary: 147 mg/dL — ABNORMAL HIGH (ref 70–99)
Glucose-Capillary: 138 mg/dL — ABNORMAL HIGH (ref 70–99)

## 2019-11-02 LAB — C-REACTIVE PROTEIN: CRP: 2.1 mg/dL — ABNORMAL HIGH (ref <1.0)

## 2019-11-02 LAB — MAGNESIUM: Magnesium: 1.6 mg/dL — ABNORMAL LOW (ref 1.7–2.4)

## 2019-11-02 LAB — D-DIMER, QUANTITATIVE: D-Dimer, Quant: 1.03 ug/mL-FEU — ABNORMAL HIGH (ref 0.00–0.50)

## 2019-11-02 MED ORDER — FUROSEMIDE 20 MG PO TABS
20.0000 mg | ORAL_TABLET | Freq: Every day | ORAL | Status: DC
  Administered 2019-11-02 – 2019-11-04 (×3): 20 mg via ORAL
  Filled 2019-11-02 (×3): qty 1

## 2019-11-02 NOTE — Progress Notes (Signed)
PROGRESS NOTE    Kathy Caldwell    Code Status: Full Code  [id number] DOB: 1945-11-27 DOA: 10/29/2019  PCP: Patient, No Pcp Per    Hospital Summary  This is a 75 year old female with history of type 2 diabetes, hypertension who tested positive for COVID-19 several days ago seen at [facility] regional on 1/5, CT chest negative and was not hypoxic then then having worsening symptoms.  Had loss of taste but not smell.  Also with cough.  Was satting 84% at an age and sent in by EMS on 1/8.  In ED satting 86% with ambulation on room air requiring 2 L nasal cannula for 90%.  A & P   Principal Problem:   Acute hypoxemic respiratory failure due to COVID-19 [facility]) Active Problems:   HTN (hypertension)   DM2 (diabetes mellitus, type 2) (HCC)   AKI (acute kidney injury) ([place])   Anxiety and depression   1. Acute hypoxic respiratory failure secondary to COVID-19, suspected COPD exacerbation (no formal dx) a. Improving O2 requirements with improved wheezing, still hoarse b. Day 5/5 remdesivir c. Day 5/10 Decadron d. Combivent standing, albuterol q6h PRN e. Continue to trend inflammatory markers f. Weight up (though may not be accurate) will restart lasix as well g. Continue azithromycin atypical coverage 2. Type 2 diabetes with hyperglycemia in setting of steroids a. Continue Lantus 40 units nightly and sliding scale insulin 3 times daily with meals b. Continue novolog 5 u three times daily with meals 3. AKI, unknown baseline a. Improved with IV fluids b. Continue to hold lisinopril c. Restarting home lasix 4. Hypertension a. Continue amlodipine b. Coreg for tachycardia and hypertension c. Hold lisinopril and Lasix as above d. Check daily weight   DVT prophylaxis: Lovenox Family Communication: number in file does not work Disposition Plan: Progress W, [facility] maximum to capacity is 4 L/min which is mainly for people on hospice.  Main barrier to discharge is patient's O2  requirement  Consultants  None  Procedures  None  Antibiotics   Anti-infectives (From admission, onward)   Start     Dose/Rate Route Frequency Ordered Stop   11/01/19 1600  azithromycin (ZITHROMAX) tablet 250 mg     250 mg Oral Daily 10/31/19 1538 11/05/19 0959   10/31/19 1600  azithromycin (ZITHROMAX) tablet 500 mg     500 mg Oral Daily 10/31/19 1538 10/31/19 1737   10/30/19 2200  remdesivir 100 mg in sodium chloride 0.9 % 100 mL IVPB     100 mg 200 mL/hr over 30 Minutes Intravenous Daily 10/29/19 2115 11/03/19 2159   10/29/19 2130  remdesivir 200 mg in sodium chloride 0.9% 250 mL IVPB     200 mg 580 mL/hr over 30 Minutes Intravenous Once 10/29/19 2115 10/30/19 0004           Subjective   Patient seen and examined at bedside sleeping, resting comfortably.  Denies any complaints at this time and states she has improved symptoms  Objective   Vitals:   11/02/19 0656 11/02/19 0800 11/02/19 1148 11/02/19 1305  BP:  (!) 152/78  131/70  Pulse:  86 82 90  Resp:  13 11 16   Temp:    (!) 97.4 F (36.3 C)  TempSrc:    Oral  SpO2:  95% 95% 95%  Weight: 69.6 kg     Height:        Intake/Output Summary (Last 24 hours) at 11/02/2019 1820 Last data filed at 11/02/2019 0655 Gross per 24 hour  Intake --  Output 1050 ml  Net -1050 ml   Filed Weights   10/29/19 2200 11/02/19 0656  Weight: 59 kg 69.6 kg    Examination:  Physical Exam Vitals and nursing note reviewed.  Constitutional:      General: She is not in acute distress.    Comments: Improved hoarseness  Cardiovascular:     Pulses: Normal pulses.  Pulmonary:     Effort: Pulmonary effort is normal. No respiratory distress.     Breath sounds: Wheezing present.  Musculoskeletal:        General: No swelling or tenderness.  Neurological:     Mental Status: She is alert. Mental status is at baseline.  Psychiatric:        Mood and Affect: Mood normal.     Data Reviewed: I have personally reviewed following  labs and imaging studies  CBC: Recent Labs  Lab 10/29/19 1904 10/30/19 0609 10/31/19 0500 11/01/19 0816 11/02/19 0826  WBC 8.9 6.5 6.8 13.9* 10.4  NEUTROABS 6.6 6.1 6.1 12.7* 9.1*  HGB 11.0* 10.3* 10.5* 10.6* 10.8*  HCT 37.6 34.7* 35.6* 34.5* 35.8*  MCV 90.2 88.3 87.5 85.2 85.6  PLT 244 202 235 248 [id number]   Basic Metabolic Panel: Recent Labs  Lab 10/29/19 1904 10/30/19 0609 10/31/19 0500 11/01/19 0816 11/02/19 0826  NA 131* 132* 134* 137 137  K 4.6 4.6 5.0 4.6 4.5  CL 101 102 103 106 106  CO2 20* 20* 21* 21* 24  GLUCOSE 104* 218* 333* 222* 87  BUN 36* 31* 30* 38* 36*  CREATININE 2.24* 1.86* 1.48* 1.42* 1.35*  CALCIUM 8.2* 8.1* 8.4* 8.5* 8.4*  MG  --   --  1.3*  --  1.6*   GFR: Estimated Creatinine Clearance: 34.7 mL/min (A) (by C-G formula based on SCr of 1.35 mg/dL (H)). Liver Function Tests: Recent Labs  Lab 10/29/19 1904 10/30/19 0609 10/31/19 0500 11/01/19 0816  AST 26 15 16 20   ALT 14 14 13 15   ALKPHOS 74 71 73 82  BILITOT 0.6 0.3 0.2* 0.3  PROT 5.8* 5.5* 5.7* 5.7*  ALBUMIN 2.9* 2.7* 2.8* 2.7*   No results for input(s): LIPASE, AMYLASE in the last 168 hours. No results for input(s): AMMONIA in the last 168 hours. Coagulation Profile: No results for input(s): INR, PROTIME in the last 168 hours. Cardiac Enzymes: No results for input(s): CKTOTAL, CKMB, CKMBINDEX, TROPONINI in the last 168 hours. BNP (last 3 results) No results for input(s): PROBNP in the last 8760 hours. HbA1C: No results for input(s): HGBA1C in the last 72 hours. CBG: Recent Labs  Lab 11/01/19 1704 11/01/19 2151 11/02/19 0809 11/02/19 1117 11/02/19 1641  GLUCAP 190* 195* 90 109* 147*   Lipid Profile: No results for input(s): CHOL, HDL, LDLCALC, TRIG, CHOLHDL, LDLDIRECT in the last 72 hours. Thyroid Function Tests: No results for input(s): TSH, T4TOTAL, FREET4, T3FREE, THYROIDAB in the last 72 hours. Anemia Panel: No results for input(s): VITAMINB12, FOLATE, FERRITIN, TIBC,  IRON, RETICCTPCT in the last 72 hours. Sepsis Labs: Recent Labs  Lab 10/29/19 1904 10/29/19 2003 10/30/19 0609  PROCALCITON  --  0.31 0.16  LATICACIDVEN 1.1  --   --     Recent Results (from the past 240 hour(s))  Blood Culture (routine x 2)     Status: None (Preliminary result)   Collection Time: 10/29/19  7:04 PM   Specimen: BLOOD LEFT HAND  Result Value Ref Range Status   Specimen Description BLOOD LEFT HAND  Final  Special Requests   Final    BOTTLES DRAWN AEROBIC AND ANAEROBIC Blood Culture adequate volume   Culture   Final    NO GROWTH 4 DAYS Performed at [facility], [place] [address] [place], [place] [address]    Report Status PENDING  Incomplete  Blood Culture (routine x 2)     Status: None (Preliminary result)   Collection Time: 10/29/19  7:06 PM   Specimen: BLOOD LEFT FOREARM  Result Value Ref Range Status   Specimen Description BLOOD LEFT FOREARM  Final   Special Requests   Final    BOTTLES DRAWN AEROBIC AND ANAEROBIC Blood Culture adequate volume   Culture   Final    NO GROWTH 4 DAYS Performed at [facility], [place] [address], [place], [place] [address]    Report Status PENDING  Incomplete         Radiology Studies: No results found.      Scheduled Meds: . amLODipine  10 mg Oral Daily  . ascorbic acid  500 mg Oral Daily  . aspirin EC  81 mg Oral Daily  . azithromycin  250 mg Oral Daily  . carvedilol  6.25 mg Oral BID WC  . cholecalciferol  1,000 Units Oral Daily  . dexamethasone  6 mg Oral Q24H  . fluticasone  1 puff Inhalation BID  . heparin injection (subcutaneous)  5,000 Units Subcutaneous Q8H  . insulin aspart  0-15 Units Subcutaneous TID WC  . insulin aspart  5 Units Subcutaneous TID WC  . insulin glargine  40 Units Subcutaneous QHS  . Ipratropium-Albuterol  1 puff Inhalation Q6H  . lactobacillus acidophilus  1 tablet Oral Daily  . LORazepam  0.25 mg Oral Daily  . mirtazapine  45 mg Oral QHS  . pantoprazole  80 mg Oral  Daily  . polyvinyl alcohol   Both Eyes BID  . risperiDONE  0.25 mg Oral QHS  . simvastatin  20 mg Oral QHS  . traZODone  100 mg Oral QHS  . venlafaxine XR  150 mg Oral Daily  . zinc sulfate  220 mg Oral Daily   Continuous Infusions: . remdesivir 100 mg in NS 100 mL 100 mg (11/01/19 2150)     LOS: 4 days    Time spent: 27 minutes with over 50% of the time coordinating the patient's care    Harold Hedge, DO [facility] Pager [phone]  If 7PM-7AM, please contact night-coverage [email or web address] Password [facility] 11/02/2019, 6:20 PM

## 2019-11-03 DIAGNOSIS — F419 Anxiety disorder, unspecified: Secondary | ICD-10-CM

## 2019-11-03 DIAGNOSIS — F329 Major depressive disorder, single episode, unspecified: Secondary | ICD-10-CM

## 2019-11-03 LAB — GLUCOSE, CAPILLARY
Glucose-Capillary: 77 mg/dL (ref 70–99)
Glucose-Capillary: 186 mg/dL — ABNORMAL HIGH (ref 70–99)
Glucose-Capillary: 214 mg/dL — ABNORMAL HIGH (ref 70–99)
Glucose-Capillary: 87 mg/dL (ref 70–99)
Glucose-Capillary: 215 mg/dL — ABNORMAL HIGH (ref 70–99)

## 2019-11-03 LAB — BASIC METABOLIC PANEL
Anion gap: 9 (ref 5–15)
BUN: 40 mg/dL — ABNORMAL HIGH (ref 8–23)
CO2: 26 mmol/L (ref 22–32)
Calcium: 8.8 mg/dL — ABNORMAL LOW (ref 8.9–10.3)
Chloride: 105 mmol/L (ref 98–111)
Creatinine, Ser: 1.35 mg/dL — ABNORMAL HIGH (ref 0.44–1.00)
GFR calc Af Amer: 45 mL/min — ABNORMAL LOW (ref >60)
GFR calc non Af Amer: 39 mL/min — ABNORMAL LOW (ref >60)
Glucose, Bld: 79 mg/dL (ref 70–99)
Potassium: 5 mmol/L (ref 3.5–5.1)
Sodium: 140 mmol/L (ref 135–145)

## 2019-11-03 LAB — C-REACTIVE PROTEIN: CRP: 1.2 mg/dL — ABNORMAL HIGH (ref <1.0)

## 2019-11-03 LAB — CULTURE, BLOOD (ROUTINE X 2)
Culture: NO GROWTH
Culture: NO GROWTH
Special Requests: ADEQUATE
Special Requests: ADEQUATE

## 2019-11-03 LAB — CBC WITH DIFFERENTIAL/PLATELET
Abs Immature Granulocytes: 0.27 10*3/uL — ABNORMAL HIGH (ref 0.00–0.07)
Basophils Absolute: 0 10*3/uL (ref 0.0–0.1)
Basophils Relative: 0 %
Eosinophils Absolute: 0 10*3/uL (ref 0.0–0.5)
Eosinophils Relative: 0 %
HCT: 37.9 % (ref 36.0–46.0)
Hemoglobin: 11.6 g/dL — ABNORMAL LOW (ref 12.0–15.0)
Immature Granulocytes: 2 %
Lymphocytes Relative: 9 %
Lymphs Abs: 1.1 10*3/uL (ref 0.7–4.0)
MCH: 25.9 pg — ABNORMAL LOW (ref 26.0–34.0)
MCHC: 30.6 g/dL (ref 30.0–36.0)
MCV: 84.6 fL (ref 80.0–100.0)
Monocytes Absolute: 0.5 10*3/uL (ref 0.1–1.0)
Monocytes Relative: 4 %
Neutro Abs: 10 10*3/uL — ABNORMAL HIGH (ref 1.7–7.7)
Neutrophils Relative %: 85 %
Platelets: 291 10*3/uL (ref 150–400)
RBC: 4.48 MIL/uL (ref 3.87–5.11)
RDW: 14.5 % (ref 11.5–15.5)
WBC: 11.8 10*3/uL — ABNORMAL HIGH (ref 4.0–10.5)
nRBC: 0 % (ref 0.0–0.2)

## 2019-11-03 LAB — D-DIMER, QUANTITATIVE: D-Dimer, Quant: 1.06 ug/mL-FEU — ABNORMAL HIGH (ref 0.00–0.50)

## 2019-11-03 LAB — MAGNESIUM: Magnesium: 1.6 mg/dL — ABNORMAL LOW (ref 1.7–2.4)

## 2019-11-03 MED ORDER — MAGNESIUM SULFATE 2 GM/50ML IV SOLN
2.0000 g | Freq: Once | INTRAVENOUS | Status: AC
  Administered 2019-11-03: 2 g via INTRAVENOUS
  Filled 2019-11-03: qty 50

## 2019-11-03 NOTE — Plan of Care (Signed)

## 2019-11-03 NOTE — Progress Notes (Signed)
Physical Therapy Treatment Patient Details Name: Kathy Caldwell MRN: [id number] DOB: 1946/09/17 Today's Date: 11/03/2019    History of Present Illness 74 y.o. female with medical history significant of DM2, HTN.  Patient tested positive for COVID several days ago at [facility].  Patient seen at [facility] on 1/5, CT chest neg and not hypoxic then. EMS brought from [facility] to ED with hypoxia. In ED satting 86% with ambulation on room air requiring 2 L nasal cannula for 90%. Admitted 10/29/19 for treatment of COVID.    PT Comments    Pt states she is feeling much better today. Pt on 4L O2 via [place] on entry and able to maintain SaO2 >90%O2 except for one small bout with ambulation that quickly rebounded with cues to breathe through her nose. Pt was found to be incontinent of urine on entry and was agreeable to get over to chair so that her bed linens could be changed. Pt is min A for bed mobility, min guard for transfers and min A for steadying with ambulation to bathroom and back. D/c plans remain appropriate at this time. PT will continue to follow acutely.   Follow Up Recommendations  Other (comment)(return to [facility])     Equipment Recommendations  None recommended by PT    Recommendations for Other Services OT consult     Precautions / Restrictions Precautions Precautions: Fall Restrictions Weight Bearing Restrictions: No    Mobility  Bed Mobility Overal bed mobility: Needs Assistance Bed Mobility: Supine to Sit     Supine to sit: Min assist     General bed mobility comments: min A for pad scoot of hips to EoB  Transfers Overall transfer level: Needs assistance Equipment used: Rolling walker (2 wheeled) Transfers: Sit to/from Stand Sit to Stand: Min guard         General transfer comment: min guard for safety, good power up, increased effort to steady, use of handrail in bathroom to stand from low toilet  Ambulation/Gait Ambulation/Gait  assistance: Min assist Gait Distance (Feet): 20 Feet Assistive device: Rolling walker (2 wheeled) Gait Pattern/deviations: Step-through pattern;Decreased step length - right;Decreased step length - left;Shuffle Gait velocity: slowed Gait velocity interpretation: 1.31 - 2.62 ft/sec, indicative of limited community ambulator General Gait Details: min A for ambulation from bed to bathroom and back       Balance Overall balance assessment: Needs assistance Sitting-balance support: Feet unsupported;Feet supported;No upper extremity supported Sitting balance-Leahy Scale: Good     Standing balance support: During functional activity;Bilateral upper extremity supported Standing balance-Leahy Scale: Poor Standing balance comment: requires RW assist                            Cognition Arousal/Alertness: Awake/alert Behavior During Therapy: WFL for tasks assessed/performed Overall Cognitive Status: Within Functional Limits for tasks assessed Area of Impairment: Problem solving;Following commands                       Following Commands: Follows multi-step commands with increased time     Problem Solving: Requires verbal cues;Slow processing General Comments: requires more time for processing and verbal cues for task completion          General Comments General comments (skin integrity, edema, etc.): Pt on 4L O2 via [place] on entry with SaO2 96%O2 with ambulation SaO2 dropped to low 90s with one instance of 88%O2 however quickly rebounded with cues for pt to  breathe inthrough her nose      Pertinent Vitals/Pain Pain Assessment: No/denies pain    Home Living Family/patient expects to be discharged to:: Assisted living             Home Equipment: Walker - 2 wheels      Prior Function Level of Independence: Needs assistance  Gait / Transfers Assistance Needed: ambulates with a RW sometimes ADL's / Homemaking Assistance Needed: facility provides meals, and  assist with bathing and dressing     PT Goals (current goals can now be found in the care plan section) Acute Rehab PT Goals Patient Stated Goal: get back to doing things at her facility PT Goal Formulation: With patient Time For Goal Achievement: 11/15/19 Potential to Achieve Goals: Good Progress towards PT goals: Progressing toward goals    Frequency    Min 3X/week      PT Plan Current plan remains appropriate       AM-PAC PT "6 Clicks" Mobility   Outcome Measure  Help needed turning from your back to your side while in a flat bed without using bedrails?: None Help needed moving from lying on your back to sitting on the side of a flat bed without using bedrails?: A Little Help needed moving to and from a bed to a chair (including a wheelchair)?: A Little Help needed standing up from a chair using your arms (e.g., wheelchair or bedside chair)?: A Little Help needed to walk in hospital room?: A Little Help needed climbing 3-5 steps with a railing? : A Lot 6 Click Score: 18    End of Session Equipment Utilized During Treatment: Gait belt;Oxygen Activity Tolerance: Patient tolerated treatment well Patient left: in chair;with call bell/phone within reach;with chair alarm set Nurse Communication: Mobility status PT Visit Diagnosis: Unsteadiness on feet (R26.81);Other abnormalities of gait and mobility (R26.89);Muscle weakness (generalized) (M62.81);Dizziness and giddiness (R42)     Time: [id number] PT Time Calculation (min) (ACUTE ONLY): 33 min  Charges:  $Gait Training: 8-22 mins $Therapeutic Activity: 8-22 mins                     Nichael Ehly B. Migdalia Dk PT, DPT Acute Rehabilitation Services Pager [phone] Office [phone]    [place] 11/03/2019, 5:31 PM

## 2019-11-03 NOTE — Progress Notes (Signed)
PROGRESS NOTE    Kathy Caldwell  [id number] DOB: [birth year] DOA: 10/29/2019 PCP: Patient, No Pcp Per   Brief Narrative:  Patient is a 74 year old Caucasian female with a past medical history significant for but not limited to type 2 diabetes mellitus, hypertension, anxiety and depression, as well as other comorbidities who tested positive for COVID-19 disease several days ago seen at [facility] on 10/26/2019.  She had a chest CT which was negative and she was not hypoxic then but then started having worsening symptoms.  Has had loss of taste but not her smell.  Had an associated cough and she was saturating 84% and sent in by EMS on 10/29/2019.  In the ED she was saturating 86% with ambulation on room air and then required 2 L of supplemental oxygen via nasal cannula to maintain her O2 saturation greater 90%.  Admitted for acute hypoxic respiratory failure secondary to COVID-19 disease and still requiring O2 via [place].   Assessment & Plan:   Principal Problem:   Acute hypoxemic respiratory failure due to COVID-19 [facility]) Active Problems:   HTN (hypertension)   DM2 (diabetes mellitus, type 2) (HCC)   AKI (acute kidney injury) ([place])   Anxiety and depression  Acute Hypoxic Respiratory failure secondary to COVID-19 -Admitted to [facility]  -Initial CXR showed "Very mild atelectasis is seen within the left lung base. There is no evidence of acute infiltrate, pleural effusion or pneumothorax. The heart size and mediastinal contours are within normal limits. The visualized skeletal structures are unremarkable" -Checked Baseline Inflammatory Markers and Trend Daily: Recent Labs    11/01/19 0816 11/01/19 0850 11/02/19 0826 11/03/19 0807  DDIMER 1.07*  --  1.03* 1.06*  CRP  --  2.6* 2.1* 1.2*    No results found for: SARSCOV2NAA  -SpO2: 92 % O2 Flow Rate (L/min): 5 L/min; social work she needs to be under 4 L prior to being discharged back to her skilled nursing facility -She went  from 4 L to 3 L up to 6 L yesterday and is now on 5 L -Started Treatment with 5 Days of Remdesivir and 10 Days of IV Decadron; Completed Remdesivir and is on Day 5/10 of Steroids -C/w Antitussives with Guaifenesin-Dextromethorphan 10 mL po q4hprn Cough and Chlorpheniramine-Hydrocodone 5 mL po q12hprn -Check Blood Cx x2 and showed NGTD at 2 Days -Continuing Azithromycin 250 mg po Daily x 4 more Doses  -C/w Zinc 220 mg po Daily and Vitamin C 500 mg po Daily  -Airborne and Contact Precautions -C/w Combivent Scheduled 1 puff IH q6h and Albuterol 2 puff IH q4hprn -Add Flutter Valve and Incentive Spirometry  -Repeat CXR 10/31/2019 showed "The heart size and mediastinal contours are within normal limits. Aortic atherosclerosis. No effusions. Both lungs are clear. The visualized skeletal structures are unremarkable."  -Continue to monitor and trend inflammatory markers and repeat chest x-ray imaging in the a.m.  Type 2 diabetes with hyperglycemia in setting of steroids -Continue Lantus 40 units nightly and Moderate Novolog SSI AC -Added Novolog 5 u three times daily with meals -CBG's ranging from 109-214 -Expect CBG's to worsen in the setting of Steroid Demargination  AKI on CKD Stage 2 -Unknown baseline -Improved with IV fluids and now BUN/Cr is 40/1.35 -Continue to hold lisinopril but will resume Lasix  -Continue to Monitor Strict I's and O's  Hypertension -Continue Amlodipine 10 mg po Daily  -C/w Carvedilol 6.25 mg po BID for tachycardia and hypertension -Continue to Hold Lisinopril  -Resumed Lasix 20 mg  as above -BP's   Suspect COPD exacerbation without history of diagnosed COPD but has history of tobacco use  -Continue azithromycin atypical coverage -Inhalers as above as well as Steroids  -Repeat CXR in the AM  Hypomagnesemia -Patient's Mag Level was 1.6 -Replete with IV Mag Sulfate 2 grams -Continue to Monitor and Replete as Necessary -Repeat Mag Level in the AM   GERD -C/w  Pantoprazole 80 mg po Daily  Anxiety/Depression/SchizoAffective  -C/w Lorazepam 0.25 mg po BIDprn Anxiety and Agitation and C/w Lorazepam 0.25 mg Daily  -C/w Mirtazapine 45 m po qHS -C/w Risperidone 0.25 mg po qHS, Venlafaxine XR 150 mg po Daily and Trazodone 100 mg po qHS  DVT prophylaxis: Heparin 5,000 units sq q8h Code Status: FULL CODE  Family Communication: No family present at bedside  Disposition Plan: Back to ALF when O2 requirements consistently <4 Liters   Consultants:    None   Procedures: None   Antimicrobials:  Anti-infectives (From admission, onward)   Start     Dose/Rate Route Frequency Ordered Stop   11/01/19 1600  azithromycin (ZITHROMAX) tablet 250 mg     250 mg Oral Daily 10/31/19 1538 11/05/19 0959   10/31/19 1600  azithromycin (ZITHROMAX) tablet 500 mg     500 mg Oral Daily 10/31/19 1538 10/31/19 1737   10/30/19 2200  remdesivir 100 mg in sodium chloride 0.9 % 100 mL IVPB     100 mg 200 mL/hr over 30 Minutes Intravenous Daily 10/29/19 2115 11/02/19 2143   10/29/19 2130  remdesivir 200 mg in sodium chloride 0.9% 250 mL IVPB     200 mg 580 mL/hr over 30 Minutes Intravenous Once 10/29/19 2115 10/30/19 0004     Subjective: Seen And examined at bedside and she still remains on supplemental oxygen via nasal cannula.  Pain, lightheadedness or dizziness but does feel short of breath still.  No other concerns or complaints at this time.  Nursing states that she has mood swings but she was very pleasant with me.  No other concerns or complaints this time.  Objective: Vitals:   11/02/19 1305 11/02/19 2000 11/03/19 0000 11/03/19 1222  BP: 131/70 (!) 143/69 (!) 158/79   Pulse: 90 83 81   Resp: 16     Temp: (!) 97.4 F (36.3 C)   98 F (36.7 C)  TempSrc: Oral   Oral  SpO2: 95% 92% 92%   Weight:      Height:        Intake/Output Summary (Last 24 hours) at 11/03/2019 1709 Last data filed at 11/03/2019 [id number] Gross per 24 hour  Intake 120 ml  Output 1700 ml   Net -1580 ml   Filed Weights   10/29/19 2200 11/02/19 0656  Weight: 59 kg 69.6 kg   Examination: Physical Exam:  Constitutional: Thin elderly Caucasian female in NAD and appears calm  Eyes: Lids and conjunctivae normal, sclerae anicteric  ENMT: External Ears, Nose appear normal. Grossly normal hearing.  Neck: Appears normal, supple, no cervical masses, normal ROM, no appreciable thyromegaly; no JVD Respiratory: Diminished to auscultation bilaterally with coarse breath sounds, no wheezing, rales, rhonchi or crackles. Normal respiratory effort and patient is not tachypenic. No accessory muscle use. Wearing Supplemental O2 via [place] Cardiovascular: RRR, no murmurs / rubs / gallops. S1 and S2 auscultated. No extremity edema.  Abdomen: Soft, non-tender, non-distended. Bowel sounds positive x4.  GU: Deferred. Musculoskeletal: No clubbing / cyanosis of digits/nails. No joint deformity upper and lower extremities.  Skin: No rashes, lesions,  ulcers on a limited skin evaluation. No induration; Warm and dry.  Neurologic: CN 2-12 grossly intact with no focal deficits.  Romberg sign and cerebellar reflexes not assessed.  Psychiatric: Normal judgment and insight. Alert and oriented x 3. Pleasant mood and appropriate affect.   Data Reviewed: I have personally reviewed following labs and imaging studies  CBC: Recent Labs  Lab 10/30/19 0609 10/31/19 0500 11/01/19 0816 11/02/19 0826 11/03/19 0807  WBC 6.5 6.8 13.9* 10.4 11.8*  NEUTROABS 6.1 6.1 12.7* 9.1* 10.0*  HGB 10.3* 10.5* 10.6* 10.8* 11.6*  HCT 34.7* 35.6* 34.5* 35.8* 37.9  MCV 88.3 87.5 85.2 85.6 84.6  PLT 202 235 248 268 [id number]   Basic Metabolic Panel: Recent Labs  Lab 10/30/19 0609 10/31/19 0500 11/01/19 0816 11/02/19 0826 11/03/19 0807  NA 132* 134* 137 137 140  K 4.6 5.0 4.6 4.5 5.0  CL 102 103 106 106 105  CO2 20* 21* 21* 24 26  GLUCOSE 218* 333* 222* 87 79  BUN 31* 30* 38* 36* 40*  CREATININE 1.86* 1.48* 1.42* 1.35* 1.35*   CALCIUM 8.1* 8.4* 8.5* 8.4* 8.8*  MG  --  1.3*  --  1.6* 1.6*   GFR: Estimated Creatinine Clearance: 34.7 mL/min (A) (by C-G formula based on SCr of 1.35 mg/dL (H)). Liver Function Tests: Recent Labs  Lab 10/29/19 1904 10/30/19 0609 10/31/19 0500 11/01/19 0816  AST 26 15 16 20   ALT 14 14 13 15   ALKPHOS 74 71 73 82  BILITOT 0.6 0.3 0.2* 0.3  PROT 5.8* 5.5* 5.7* 5.7*  ALBUMIN 2.9* 2.7* 2.8* 2.7*   No results for input(s): LIPASE, AMYLASE in the last 168 hours. No results for input(s): AMMONIA in the last 168 hours. Coagulation Profile: No results for input(s): INR, PROTIME in the last 168 hours. Cardiac Enzymes: No results for input(s): CKTOTAL, CKMB, CKMBINDEX, TROPONINI in the last 168 hours. BNP (last 3 results) No results for input(s): PROBNP in the last 8760 hours. HbA1C: No results for input(s): HGBA1C in the last 72 hours. CBG: Recent Labs  Lab 11/02/19 1641 11/02/19 2147 11/03/19 0831 11/03/19 1219 11/03/19 1241  GLUCAP 147* 138* 77 186* 214*   Lipid Profile: No results for input(s): CHOL, HDL, LDLCALC, TRIG, CHOLHDL, LDLDIRECT in the last 72 hours. Thyroid Function Tests: No results for input(s): TSH, T4TOTAL, FREET4, T3FREE, THYROIDAB in the last 72 hours. Anemia Panel: No results for input(s): VITAMINB12, FOLATE, FERRITIN, TIBC, IRON, RETICCTPCT in the last 72 hours. Sepsis Labs: Recent Labs  Lab 10/29/19 1904 10/29/19 2003 10/30/19 0609  PROCALCITON  --  0.31 0.16  LATICACIDVEN 1.1  --   --     Recent Results (from the past 240 hour(s))  Blood Culture (routine x 2)     Status: None   Collection Time: 10/29/19  7:04 PM   Specimen: BLOOD LEFT HAND  Result Value Ref Range Status   Specimen Description BLOOD LEFT HAND  Final   Special Requests   Final    BOTTLES DRAWN AEROBIC AND ANAEROBIC Blood Culture adequate volume   Culture   Final    NO GROWTH 5 DAYS Performed at [facility], [place] [address] [place], [place] [address]    Report  Status 11/03/2019 FINAL  Final  Blood Culture (routine x 2)     Status: None   Collection Time: 10/29/19  7:06 PM   Specimen: BLOOD LEFT FOREARM  Result Value Ref Range Status   Specimen Description BLOOD LEFT FOREARM  Final  Special Requests   Final    BOTTLES DRAWN AEROBIC AND ANAEROBIC Blood Culture adequate volume   Culture   Final    NO GROWTH 5 DAYS Performed at [facility], [place] [address], [place], [place] [address]    Report Status 11/03/2019 FINAL  Final    Radiology Studies: No results found.   Scheduled Meds: . amLODipine  10 mg Oral Daily  . ascorbic acid  500 mg Oral Daily  . aspirin EC  81 mg Oral Daily  . azithromycin  250 mg Oral Daily  . carvedilol  6.25 mg Oral BID WC  . cholecalciferol  1,000 Units Oral Daily  . dexamethasone  6 mg Oral Q24H  . fluticasone  1 puff Inhalation BID  . furosemide  20 mg Oral Daily  . heparin injection (subcutaneous)  5,000 Units Subcutaneous Q8H  . insulin aspart  0-15 Units Subcutaneous TID WC  . insulin aspart  5 Units Subcutaneous TID WC  . insulin glargine  40 Units Subcutaneous QHS  . Ipratropium-Albuterol  1 puff Inhalation Q6H  . lactobacillus acidophilus  1 tablet Oral Daily  . LORazepam  0.25 mg Oral Daily  . mirtazapine  45 mg Oral QHS  . pantoprazole  80 mg Oral Daily  . polyvinyl alcohol   Both Eyes BID  . risperiDONE  0.25 mg Oral QHS  . simvastatin  20 mg Oral QHS  . traZODone  100 mg Oral QHS  . venlafaxine XR  150 mg Oral Daily  . zinc sulfate  220 mg Oral Daily   Continuous Infusions:   LOS: 5 days    Kerney Elbe, DO [facility] PAGER is on [place]  If 7PM-7AM, please contact night-coverage [email or web address]

## 2019-11-04 ENCOUNTER — Inpatient Hospital Stay (HOSPITAL_COMMUNITY): Payer: Medicare HMO

## 2019-11-04 LAB — COMPREHENSIVE METABOLIC PANEL
ALT: 14 U/L (ref 0–44)
AST: 15 U/L (ref 15–41)
Albumin: 2.6 g/dL — ABNORMAL LOW (ref 3.5–5.0)
Alkaline Phosphatase: 75 U/L (ref 38–126)
Anion gap: 8 (ref 5–15)
BUN: 43 mg/dL — ABNORMAL HIGH (ref 8–23)
CO2: 25 mmol/L (ref 22–32)
Calcium: 8.1 mg/dL — ABNORMAL LOW (ref 8.9–10.3)
Chloride: 101 mmol/L (ref 98–111)
Creatinine, Ser: 1.75 mg/dL — ABNORMAL HIGH (ref 0.44–1.00)
GFR calc Af Amer: 33 mL/min — ABNORMAL LOW (ref >60)
GFR calc non Af Amer: 28 mL/min — ABNORMAL LOW (ref >60)
Glucose, Bld: 273 mg/dL — ABNORMAL HIGH (ref 70–99)
Potassium: 4.4 mmol/L (ref 3.5–5.1)
Sodium: 134 mmol/L — ABNORMAL LOW (ref 135–145)
Total Bilirubin: 0.5 mg/dL (ref 0.3–1.2)
Total Protein: 5.1 g/dL — ABNORMAL LOW (ref 6.5–8.1)

## 2019-11-04 LAB — FIBRINOGEN: Fibrinogen: 470 mg/dL (ref 210–475)

## 2019-11-04 LAB — CBC WITH DIFFERENTIAL/PLATELET
Abs Immature Granulocytes: 0.3 10*3/uL — ABNORMAL HIGH (ref 0.00–0.07)
Basophils Absolute: 0 10*3/uL (ref 0.0–0.1)
Basophils Relative: 0 %
Eosinophils Absolute: 0 10*3/uL (ref 0.0–0.5)
Eosinophils Relative: 0 %
HCT: 35.6 % — ABNORMAL LOW (ref 36.0–46.0)
Hemoglobin: 10.7 g/dL — ABNORMAL LOW (ref 12.0–15.0)
Immature Granulocytes: 2 %
Lymphocytes Relative: 7 %
Lymphs Abs: 0.9 10*3/uL (ref 0.7–4.0)
MCH: 25.6 pg — ABNORMAL LOW (ref 26.0–34.0)
MCHC: 30.1 g/dL (ref 30.0–36.0)
MCV: 85.2 fL (ref 80.0–100.0)
Monocytes Absolute: 1 10*3/uL (ref 0.1–1.0)
Monocytes Relative: 8 %
Neutro Abs: 10.1 10*3/uL — ABNORMAL HIGH (ref 1.7–7.7)
Neutrophils Relative %: 83 %
Platelets: 268 10*3/uL (ref 150–400)
RBC: 4.18 MIL/uL (ref 3.87–5.11)
RDW: 14.5 % (ref 11.5–15.5)
WBC: 12.4 10*3/uL — ABNORMAL HIGH (ref 4.0–10.5)
nRBC: 0 % (ref 0.0–0.2)

## 2019-11-04 LAB — GLUCOSE, CAPILLARY
Glucose-Capillary: 203 mg/dL — ABNORMAL HIGH (ref 70–99)
Glucose-Capillary: 315 mg/dL — ABNORMAL HIGH (ref 70–99)
Glucose-Capillary: 258 mg/dL — ABNORMAL HIGH (ref 70–99)
Glucose-Capillary: 185 mg/dL — ABNORMAL HIGH (ref 70–99)

## 2019-11-04 LAB — C-REACTIVE PROTEIN: CRP: 0.7 mg/dL (ref <1.0)

## 2019-11-04 LAB — LACTATE DEHYDROGENASE: LDH: 190 U/L (ref 98–192)

## 2019-11-04 LAB — MAGNESIUM: Magnesium: 1.5 mg/dL — ABNORMAL LOW (ref 1.7–2.4)

## 2019-11-04 LAB — PHOSPHORUS: Phosphorus: 3.1 mg/dL (ref 2.5–4.6)

## 2019-11-04 LAB — D-DIMER, QUANTITATIVE: D-Dimer, Quant: 1 ug/mL-FEU — ABNORMAL HIGH (ref 0.00–0.50)

## 2019-11-04 LAB — FERRITIN: Ferritin: 15 ng/mL (ref 11–307)

## 2019-11-04 LAB — SEDIMENTATION RATE: Sed Rate: 7 mm/hr (ref 0–22)

## 2019-11-04 MED ORDER — IPRATROPIUM-ALBUTEROL 20-100 MCG/ACT IN AERS
1.0000 | INHALATION_SPRAY | Freq: Three times a day (TID) | RESPIRATORY_TRACT | Status: DC
  Administered 2019-11-04 – 2019-11-05 (×5): 1 via RESPIRATORY_TRACT
  Filled 2019-11-04: qty 4

## 2019-11-04 MED ORDER — MAGNESIUM SULFATE 2 GM/50ML IV SOLN
2.0000 g | Freq: Once | INTRAVENOUS | Status: AC
  Administered 2019-11-04: 2 g via INTRAVENOUS
  Filled 2019-11-04: qty 50

## 2019-11-04 NOTE — Progress Notes (Signed)
SATURATION QUALIFICATIONS: (This note is used to comply with regulatory documentation for home oxygen)  Patient Saturations on Room Air at Rest = 93%  Patient Saturations on Room Air while Ambulating = 89%  Please briefly explain why patient needs home oxygen: Pt does not meet requirements for needing home O2

## 2019-11-04 NOTE — Progress Notes (Signed)
PROGRESS NOTE    Kathy Caldwell  [id number] DOB: 1946-02-26 DOA: 10/29/2019 PCP: Patient, No Pcp Per   Brief Narrative:  Patient is a 74 year old Caucasian female with a past medical history significant for but not limited to type 2 diabetes mellitus, hypertension, anxiety and depression, as well as other comorbidities who tested positive for COVID-19 disease several days ago seen at [facility] regional on 10/26/2019.  She had a chest CT which was negative and she was not hypoxic then but then started having worsening symptoms.  Has had loss of taste but not her smell.  Had an associated cough and she was saturating 84% and sent in by EMS on 10/29/2019.  In the ED she was saturating 86% with ambulation on room air and then required 2 L of supplemental oxygen via nasal cannula to maintain her O2 saturation greater 90%.  Admitted for acute hypoxic respiratory failure secondary to COVID-19 disease and still requiring O2 via [place]   Assessment & Plan:   Principal Problem:   Acute hypoxemic respiratory failure due to COVID-19 [facility]) Active Problems:   HTN (hypertension)   DM2 (diabetes mellitus, type 2) (HCC)   AKI (acute kidney injury) ([place])   Anxiety and depression  Acute Hypoxic Respiratory failure secondary to COVID-19 -Admitted to [facility]  -Initial CXR showed "Very mild atelectasis is seen within the left lung base. There is no evidence of acute infiltrate, pleural effusion or pneumothorax. The heart size and mediastinal contours are within normal limits. The visualized skeletal structures are unremarkable" -Checked Baseline Inflammatory Markers and Trend Daily: Recent Labs    11/02/19 0826 11/03/19 0807 11/04/19 0227 11/04/19 0614  DDIMER 1.03* 1.06*  --  1.00*  FERRITIN  --   --   --  15  LDH  --   --  190  --   CRP 2.1* 1.2*  --  0.7    No results found for: SARSCOV2NAA  -SpO2: 94 % O2 Flow Rate (L/min): 3 L/min; social work she needs to be under 4 L prior to being  discharged back to her skilled nursing facility -She has been intermittently wearing supplemental oxygen and has been on at least 2 L.  We will do a home amatory screen prior to discharge -Started Treatment with 5 Days of Remdesivir and 10 Days of IV Decadron; Completed Remdesivir and is on Day 6/10 of Steroids -C/w Antitussives with Guaifenesin-Dextromethorphan 10 mL po q4hprn Cough and Chlorpheniramine-Hydrocodone 5 mL po q12hprn -Check Blood Cx x2 and showed NGTD at 5 Days -Continuing Azithromycin 250 mg po Daily x 4 more Doses this was completed today -C/w Zinc 220 mg po Daily and Vitamin C 500 mg po Daily  -Airborne and Contact Precautions -C/w Combivent Scheduled 1 puff IH q6h and Albuterol 2 puff IH q4hprn -Add Flutter Valve and Incentive Spirometry  -Repeat CXR 10/31/2019 showed "The heart size and mediastinal contours are within normal limits. Aortic atherosclerosis. No effusions. Both lungs are clear. The visualized skeletal structures are unremarkable." -Repeat chest x-ray 11/04/2019 showed "Heart size remains normal. Aortic atherosclerosis. Minimal patchy infiltrate or atelectasis at the lung bases. No dense consolidation or lobar collapse. No effusions." -Inflammatory markers have improved as well as chest x-ray imaging -We will do ambulatory home O2 screen prior to discharge to ensure that she can go back to her facility she needs to be on less than 4 L  Type 2 diabetes with hyperglycemia in setting of steroids -Continue Lantus 40 units nightly and Moderate Novolog SSI  AC -Added Novolog 5 u three times daily with meals -CBG's ranging from 87-315 -Expect CBG's to worsen in the setting of Steroid Demargination  AKI on CKD Stage 2 -Unknown baseline -Improved with IV fluids but now BUN/Cr went form 40/1.35 -> 43/1.75 -Continue to hold lisinopril but resumed Lasix and had worsening Renal Fxn; Continue to Hold Lasix again   -Continue to Monitor Strict I's and  O's  Hypertension -Continue Amlodipine 10 mg po Daily  -C/w Carvedilol 6.25 mg po BID for tachycardia and hypertension -Continue to Hold Lisinopril  -Resumed Lasix 20 mg but will hold again  -BP was 141/77  Suspect COPD exacerbation without history of diagnosed COPD but has history of tobacco use  -Continued Azithromycin atypical coverage -Inhalers as above as well as Steroids  -Repeat CXR as above  Hypomagnesemia -Patient's Mag Level was 1.5 -Replete with IV Mag Sulfate 2 grams -Continue to Monitor and Replete as Necessary -Repeat Mag Level in the AM   GERD -C/w Pantoprazole 80 mg po Daily  Anxiety/Depression/SchizoAffective  -C/w Lorazepam 0.25 mg po BIDprn Anxiety and Agitation and C/w Lorazepam 0.25 mg Daily  -C/w Mirtazapine 45 m po qHS -C/w Risperidone 0.25 mg po qHS, Venlafaxine XR 150 mg po Daily and Trazodone 100 mg po qHS   Leukocytosis -In the setting of steroid demargination -Patient WBC went from 11.8 and is now 12.4 -Continue monitor and trend -Completed course of antibiotics -Repeat CBC in a.m.  Hyponatremia -Likely in the setting of furosemide usage -Continue to hold furosemide for now -Patient sodium went from 140 is now 134  -Continue monitor and trend and repeat CMP in a.m.  Normocytic Anemia -Patient's Hemoglobin/Hematocrit went from 11.6/37.9 is now 10.7/35.6 -Check anemia panel in a.m. -Continue to monitor for signs and symptoms of bleeding; currently no overt bleeding noted -Repeat CBC in a.m.  DVT prophylaxis: Heparin 5,000 units sq q8h Code Status: FULL CODE  Family Communication: No family present at bedside  Disposition Plan: Back to ALF when O2 requirements consistently <4 Liters and anticipate this in the next 24 to 48 hours  Consultants:    None   Procedures: None   Antimicrobials:  Anti-infectives (From admission, onward)   Start     Dose/Rate Route Frequency Ordered Stop   11/01/19 1600  azithromycin (ZITHROMAX) tablet 250  mg     250 mg Oral Daily 10/31/19 1538 11/04/19 0844   10/31/19 1600  azithromycin (ZITHROMAX) tablet 500 mg     500 mg Oral Daily 10/31/19 1538 10/31/19 1737   10/30/19 2200  remdesivir 100 mg in sodium chloride 0.9 % 100 mL IVPB     100 mg 200 mL/hr over 30 Minutes Intravenous Daily 10/29/19 2115 11/02/19 2143   10/29/19 2130  remdesivir 200 mg in sodium chloride 0.9% 250 mL IVPB     200 mg 580 mL/hr over 30 Minutes Intravenous Once 10/29/19 2115 10/30/19 0004     Subjective: Seen And examined at bedside and she is feeling okay.  States that she slept fairly well.  Was a little hoarse and this developed last night and states that she is losing her voice.  No chest pain, lightheadedness or dizziness.  No nausea or vomiting.  Has been intermittently using supplemental oxygen via nasal cannula and when I walked in it was off.  Nursing to ambulate the patient to do an ambulatory home O2 screen  Objective: Vitals:   11/03/19 1222 11/03/19 2155 11/04/19 0427 11/04/19 1348  BP:  133/69 130/72 (!) 141/77  Pulse:  82 80 86  Resp:  14 14 13   Temp: 98 F (36.7 C) 98.3 F (36.8 C) 97.6 F (36.4 C) 98.1 F (36.7 C)  TempSrc: Oral Axillary Oral Oral  SpO2:  97% 97% 94%  Weight:   69.8 kg   Height:        Intake/Output Summary (Last 24 hours) at 11/04/2019 1621 Last data filed at 11/04/2019 1348 Gross per 24 hour  Intake 120 ml  Output 1550 ml  Net -1430 ml   Filed Weights   10/29/19 2200 11/02/19 0656 11/04/19 0427  Weight: 59 kg 69.6 kg 69.8 kg   Examination: Physical Exam:  Constitutional: Thin elderly Caucasian female in, NAD and appears calm and comfortable Eyes: Lids and conjunctivae normal, sclerae anicteric  ENMT: External Ears, Nose appear normal. Grossly normal hearing. Has a hoarse voice when speaking to me Neck: Appears normal, supple, no cervical masses, normal ROM, no appreciable thyromegaly; no JVD Respiratory: Diminished to auscultation bilaterally, no wheezing,  rales, rhonchi or crackles. Normal respiratory effort and patient is not tachypenic. No accessory muscle use. Unlabored breathing and has been intermittently using Supplemental O2; O2 was not in her nose when I arrived  Cardiovascular: RRR, no murmurs / rubs / gallops. S1 and S2 auscultated. No extremity edema. Abdomen: Soft, non-tender, non-distended. Bowel sounds positive x4.  GU: Deferred. Musculoskeletal: No clubbing / cyanosis of digits/nails. Normal strength and muscle tone.  Skin: No rashes, lesions, ulcers on a limited skin evaluation. No induration; Warm and dry.  Neurologic: CN 2-12 grossly intact with no focal deficits. Romberg sign and cerebellar reflexes not assessed.  Psychiatric: Normal judgment and insight. Alert and oriented x 3. Pleasant mood and appropriate affect.   Data Reviewed: I have personally reviewed following labs and imaging studies  CBC: Recent Labs  Lab 10/31/19 0500 11/01/19 0816 11/02/19 0826 11/03/19 0807 11/04/19 0227  WBC 6.8 13.9* 10.4 11.8* 12.4*  NEUTROABS 6.1 12.7* 9.1* 10.0* 10.1*  HGB 10.5* 10.6* 10.8* 11.6* 10.7*  HCT 35.6* 34.5* 35.8* 37.9 35.6*  MCV 87.5 85.2 85.6 84.6 85.2  PLT 235 248 268 291 [id number]   Basic Metabolic Panel: Recent Labs  Lab 10/31/19 0500 11/01/19 0816 11/02/19 0826 11/03/19 0807 11/04/19 0227  NA 134* 137 137 140 134*  K 5.0 4.6 4.5 5.0 4.4  CL 103 106 106 105 101  CO2 21* 21* 24 26 25   GLUCOSE 333* 222* 87 79 273*  BUN 30* 38* 36* 40* 43*  CREATININE 1.48* 1.42* 1.35* 1.35* 1.75*  CALCIUM 8.4* 8.5* 8.4* 8.8* 8.1*  MG 1.3*  --  1.6* 1.6* 1.5*  PHOS  --   --   --   --  3.1   GFR: Estimated Creatinine Clearance: 26.8 mL/min (A) (by C-G formula based on SCr of 1.75 mg/dL (H)). Liver Function Tests: Recent Labs  Lab 10/29/19 1904 10/30/19 0609 10/31/19 0500 11/01/19 0816 11/04/19 0227  AST 26 15 16 20 15   ALT 14 14 13 15 14   ALKPHOS 74 71 73 82 75  BILITOT 0.6 0.3 0.2* 0.3 0.5  PROT 5.8* 5.5* 5.7* 5.7*  5.1*  ALBUMIN 2.9* 2.7* 2.8* 2.7* 2.6*   No results for input(s): LIPASE, AMYLASE in the last 168 hours. No results for input(s): AMMONIA in the last 168 hours. Coagulation Profile: No results for input(s): INR, PROTIME in the last 168 hours. Cardiac Enzymes: No results for input(s): CKTOTAL, CKMB, CKMBINDEX, TROPONINI in the last 168 hours. BNP (last 3 results) No results  for input(s): PROBNP in the last 8760 hours. HbA1C: No results for input(s): HGBA1C in the last 72 hours. CBG: Recent Labs  Lab 11/03/19 1241 11/03/19 1803 11/03/19 2137 11/04/19 0812 11/04/19 1206  GLUCAP 214* 87 215* 203* 315*   Lipid Profile: No results for input(s): CHOL, HDL, LDLCALC, TRIG, CHOLHDL, LDLDIRECT in the last 72 hours. Thyroid Function Tests: No results for input(s): TSH, T4TOTAL, FREET4, T3FREE, THYROIDAB in the last 72 hours. Anemia Panel: Recent Labs    11/04/19 0614  FERRITIN 15   Sepsis Labs: Recent Labs  Lab 10/29/19 1904 10/29/19 2003 10/30/19 0609  PROCALCITON  --  0.31 0.16  LATICACIDVEN 1.1  --   --     Recent Results (from the past 240 hour(s))  Blood Culture (routine x 2)     Status: None   Collection Time: 10/29/19  7:04 PM   Specimen: BLOOD LEFT HAND  Result Value Ref Range Status   Specimen Description BLOOD LEFT HAND  Final   Special Requests   Final    BOTTLES DRAWN AEROBIC AND ANAEROBIC Blood Culture adequate volume   Culture   Final    NO GROWTH 5 DAYS Performed at [facility], [place] [address], [place], [place] [address]    Report Status 11/03/2019 FINAL  Final  Blood Culture (routine x 2)     Status: None   Collection Time: 10/29/19  7:06 PM   Specimen: BLOOD LEFT FOREARM  Result Value Ref Range Status   Specimen Description BLOOD LEFT FOREARM  Final   Special Requests   Final    BOTTLES DRAWN AEROBIC AND ANAEROBIC Blood Culture adequate volume   Culture   Final    NO GROWTH 5 DAYS Performed at [facility], [place] [address],  [place], [place] [address]    Report Status 11/03/2019 FINAL  Final    Radiology Studies: DG CHEST PORT 1 VIEW  Result Date: 11/04/2019 CLINICAL DATA:  Shortness of breath.  Coronavirus infection. EXAM: PORTABLE CHEST 1 VIEW COMPARISON:  10/31/2019 FINDINGS: Heart size remains normal. Aortic atherosclerosis. Minimal patchy infiltrate or atelectasis at the lung bases. No dense consolidation or lobar collapse. No effusions. IMPRESSION: Minimal patchy infiltrate or atelectasis at the lung bases. Otherwise clear chest. Electronically Signed   By: Nelson Chimes M.D.   On: 11/04/2019 08:27     Scheduled Meds: . amLODipine  10 mg Oral Daily  . ascorbic acid  500 mg Oral Daily  . aspirin EC  81 mg Oral Daily  . carvedilol  6.25 mg Oral BID WC  . cholecalciferol  1,000 Units Oral Daily  . dexamethasone  6 mg Oral Q24H  . fluticasone  1 puff Inhalation BID  . heparin injection (subcutaneous)  5,000 Units Subcutaneous Q8H  . insulin aspart  0-15 Units Subcutaneous TID WC  . insulin aspart  5 Units Subcutaneous TID WC  . insulin glargine  40 Units Subcutaneous QHS  . Ipratropium-Albuterol  1 puff Inhalation TID  . lactobacillus acidophilus  1 tablet Oral Daily  . LORazepam  0.25 mg Oral Daily  . mirtazapine  45 mg Oral QHS  . pantoprazole  80 mg Oral Daily  . polyvinyl alcohol   Both Eyes BID  . risperiDONE  0.25 mg Oral QHS  . simvastatin  20 mg Oral QHS  . traZODone  100 mg Oral QHS  . venlafaxine XR  150 mg Oral Daily  . zinc sulfate  220 mg Oral Daily   Continuous  Infusions:   LOS: 6 days    Kerney Elbe, DO [facility] PAGER is on [place]  If 7PM-7AM, please contact night-coverage [email or web address]

## 2019-11-05 LAB — GLUCOSE, CAPILLARY
Glucose-Capillary: 252 mg/dL — ABNORMAL HIGH (ref 70–99)
Glucose-Capillary: 240 mg/dL — ABNORMAL HIGH (ref 70–99)
Glucose-Capillary: 191 mg/dL — ABNORMAL HIGH (ref 70–99)
Glucose-Capillary: 156 mg/dL — ABNORMAL HIGH (ref 70–99)

## 2019-11-05 LAB — CBC WITH DIFFERENTIAL/PLATELET
Abs Immature Granulocytes: 0.7 10*3/uL — ABNORMAL HIGH (ref 0.00–0.07)
Basophils Absolute: 0 10*3/uL (ref 0.0–0.1)
Basophils Relative: 0 %
Eosinophils Absolute: 0 10*3/uL (ref 0.0–0.5)
Eosinophils Relative: 0 %
HCT: 37.7 % (ref 36.0–46.0)
Hemoglobin: 11.7 g/dL — ABNORMAL LOW (ref 12.0–15.0)
Lymphocytes Relative: 3 %
Lymphs Abs: 0.4 10*3/uL — ABNORMAL LOW (ref 0.7–4.0)
MCH: 25.8 pg — ABNORMAL LOW (ref 26.0–34.0)
MCHC: 31 g/dL (ref 30.0–36.0)
MCV: 83 fL (ref 80.0–100.0)
Metamyelocytes Relative: 3 %
Monocytes Absolute: 0.3 10*3/uL (ref 0.1–1.0)
Monocytes Relative: 2 %
Myelocytes: 2 %
Neutro Abs: 11.8 10*3/uL — ABNORMAL HIGH (ref 1.7–7.7)
Neutrophils Relative %: 90 %
Platelets: 333 10*3/uL (ref 150–400)
RBC: 4.54 MIL/uL (ref 3.87–5.11)
RDW: 14.4 % (ref 11.5–15.5)
WBC: 13.1 10*3/uL — ABNORMAL HIGH (ref 4.0–10.5)
nRBC: 0 % (ref 0.0–0.2)
nRBC: 0 /100 WBC

## 2019-11-05 LAB — COMPREHENSIVE METABOLIC PANEL
ALT: 14 U/L (ref 0–44)
AST: 12 U/L — ABNORMAL LOW (ref 15–41)
Albumin: 2.8 g/dL — ABNORMAL LOW (ref 3.5–5.0)
Alkaline Phosphatase: 81 U/L (ref 38–126)
Anion gap: 11 (ref 5–15)
BUN: 39 mg/dL — ABNORMAL HIGH (ref 8–23)
CO2: 25 mmol/L (ref 22–32)
Calcium: 8.7 mg/dL — ABNORMAL LOW (ref 8.9–10.3)
Chloride: 98 mmol/L (ref 98–111)
Creatinine, Ser: 1.21 mg/dL — ABNORMAL HIGH (ref 0.44–1.00)
GFR calc Af Amer: 51 mL/min — ABNORMAL LOW (ref >60)
GFR calc non Af Amer: 44 mL/min — ABNORMAL LOW (ref >60)
Glucose, Bld: 277 mg/dL — ABNORMAL HIGH (ref 70–99)
Potassium: 4.7 mmol/L (ref 3.5–5.1)
Sodium: 134 mmol/L — ABNORMAL LOW (ref 135–145)
Total Bilirubin: 0.7 mg/dL (ref 0.3–1.2)
Total Protein: 5.7 g/dL — ABNORMAL LOW (ref 6.5–8.1)

## 2019-11-05 LAB — MAGNESIUM: Magnesium: 1.8 mg/dL (ref 1.7–2.4)

## 2019-11-05 LAB — PHOSPHORUS: Phosphorus: 4.2 mg/dL (ref 2.5–4.6)

## 2019-11-05 LAB — C-REACTIVE PROTEIN: CRP: 0.6 mg/dL (ref <1.0)

## 2019-11-05 LAB — D-DIMER, QUANTITATIVE: D-Dimer, Quant: 1.06 ug/mL-FEU — ABNORMAL HIGH (ref 0.00–0.50)

## 2019-11-05 LAB — FIBRINOGEN: Fibrinogen: 478 mg/dL — ABNORMAL HIGH (ref 210–475)

## 2019-11-05 LAB — LACTATE DEHYDROGENASE: LDH: 158 U/L (ref 98–192)

## 2019-11-05 LAB — SEDIMENTATION RATE: Sed Rate: 24 mm/hr — ABNORMAL HIGH (ref 0–22)

## 2019-11-05 LAB — FERRITIN: Ferritin: 13 ng/mL (ref 11–307)

## 2019-11-05 MED ORDER — DEXAMETHASONE 6 MG PO TABS: 6.0000 mg | ORAL_TABLET | ORAL | 0 refills | Status: AC

## 2019-11-05 MED ORDER — ALBUTEROL SULFATE HFA 108 (90 BASE) MCG/ACT IN AERS: 2.0000 | INHALATION_SPRAY | RESPIRATORY_TRACT | 0 refills | Status: AC | PRN

## 2019-11-05 MED ORDER — CARVEDILOL 6.25 MG PO TABS: 6.2500 mg | ORAL_TABLET | Freq: Two times a day (BID) | ORAL | 0 refills | Status: AC

## 2019-11-05 MED ORDER — ONDANSETRON HCL 4 MG PO TABS: 4.0000 mg | ORAL_TABLET | Freq: Four times a day (QID) | ORAL | 0 refills | Status: AC | PRN

## 2019-11-05 MED ORDER — LISINOPRIL 20 MG PO TABS: 20.0000 mg | ORAL_TABLET | Freq: Every day | ORAL | 0 refills | Status: AC

## 2019-11-05 MED ORDER — LORAZEPAM 0.5 MG PO TABS: 0.2500 mg | ORAL_TABLET | ORAL | 0 refills | Status: AC

## 2019-11-05 MED ORDER — MENTHOL 3 MG MT LOZG: 1.0000 | LOZENGE | OROMUCOSAL | 12 refills | Status: AC | PRN

## 2019-11-05 MED ORDER — IPRATROPIUM-ALBUTEROL 20-100 MCG/ACT IN AERS: 1.0000 | INHALATION_SPRAY | Freq: Three times a day (TID) | RESPIRATORY_TRACT | 0 refills | Status: AC

## 2019-11-05 MED ORDER — INSULIN GLARGINE 100 UNIT/ML ~~LOC~~ SOLN: 40.0000 [IU] | Freq: Every day | SUBCUTANEOUS | 11 refills | Status: AC

## 2019-11-05 NOTE — TOC Transition Note (Signed)
Transition of Care [facility]) - CM/SW Discharge Note   Patient Details  Name: Kathy Caldwell MRN: [id number] Date of Birth: [birth year]  Transition of Care [facility]) CM/SW Contact:  Benard Halsted, LCSW Phone Number: 11/05/2019, 2:50 PM   Clinical Narrative:    Patient will DC to: [facility] Anticipated DC date: 11/05/19 Family notified: Pt notifying family Transport by: ALF to call RN once Oxygen has arrived at ALF. Then RN can call [facility] for transport: [phone] (until 10pm). Per ALF, no cutoff time for transport, however, patient may not want to dc if it is very late.   Please make sure Ativan script goes with patient.   Per MD patient ready for DC to [facility]. RN, patient, patient's family, and facility notified of DC. Discharge Summary and FL2 sent to facility. RN to call report prior to discharge [phone]). DC packet on chart. Ambulance transport requested for patient.   CSW will sign off for now as social work intervention is no longer needed. Please consult [place] again if new needs arise.  Cedric Fishman, LCSW Clinical Social Worker [phone]    Final next level of care: Assisted Living Barriers to Discharge: No Barriers Identified   Patient Goals and CMS Choice Patient states their goals for this hospitalization and ongoing recovery are:: Return to ALF CMS Medicare.gov Compare Post Acute Care list provided to:: Patient Choice offered to / list presented to : Patient  Discharge Placement                Patient to be transferred to facility by: [place] Name of family member notified: Pt notifying family Patient and family notified of of transfer: 11/05/19  Discharge Plan and Services In-house Referral: Clinical Social Work   Post Acute Care Choice: Home Health          DME Arranged: Oxygen DME Agency: [employer] Date DME Agency Contacted: 11/05/19 Time DME Agency Contacted: [phone] Representative spoke with at DME Agency: [place] ([place]) Interventions     Readmission Risk Interventions No flowsheet data found.

## 2019-11-05 NOTE — Progress Notes (Signed)
Call received from [place] that Pt's O2 has been delivered.   [facility] called & arranged to pick up Pt.

## 2019-11-05 NOTE — Care Management Important Message (Signed)
Important Message  Patient Details  Name: Kathy Caldwell MRN: [id number] Date of Birth: [birth year]   Medicare Important Message Given:  Yes - Important Message mailed due to current National Emergency  Verbal consent obtained due to current National Emergency  Relationship to patient: Self Contact Name: Koralie Congrove Call Date: 11/05/19  Time: 1316 Phone: [phone] Outcome: Spoke with contact Important Message mailed to: Patient address on file    [place] 11/05/2019, 1:16 PM

## 2019-11-05 NOTE — Progress Notes (Signed)
Report called to [facility]. Pt waiting for PT[facility].

## 2019-11-05 NOTE — Progress Notes (Signed)
Physical Therapy Treatment Patient Details Name: Kathy Caldwell MRN: [id number] DOB: 05-25-1946 Today's Date: 11/05/2019    History of Present Illness 74 y.o. female with medical history significant of DM2, HTN.  Patient tested positive for COVID several days ago at [facility].  Patient seen at [facility] on 1/5, CT chest neg and not hypoxic then. EMS brought from [facility] to ED with hypoxia. In ED satting 86% with ambulation on room air requiring 2 L nasal cannula for 90%. Admitted 10/29/19 for treatment of COVID.    PT Comments    Pt is eager to get out of bed with therapy. Pt is making good progress towards her goals and is anxious to get home. Pt is currently supervision for bed mobility, transfers and ambulation of 20 feet with RW. Pt instructed in HEP. D/c plans remain appropriate.     Follow Up Recommendations  Other (comment)(return to [facility])     Equipment Recommendations  None recommended by PT    Recommendations for Other Services OT consult     Precautions / Restrictions Precautions Precautions: Fall Restrictions Weight Bearing Restrictions: No    Mobility  Bed Mobility Overal bed mobility: Needs Assistance Bed Mobility: Supine to Sit     Supine to sit: Supervision     General bed mobility comments: supervision for safety  Transfers Overall transfer level: Needs assistance Equipment used: Rolling walker (2 wheeled) Transfers: Sit to/from Stand Sit to Stand: Supervision         General transfer comment: supervision for safety from bed surface and low toilet surface  Ambulation/Gait Ambulation/Gait assistance: Supervision Gait Distance (Feet): 24 Feet Assistive device: Rolling walker (2 wheeled) Gait Pattern/deviations: Step-through pattern;Decreased step length - right;Decreased step length - left;Shuffle Gait velocity: slowed Gait velocity interpretation: <1.8 ft/sec, indicate of risk for recurrent falls General Gait  Details: supervision for safety with slow steady gait to and from bathroom       Balance Overall balance assessment: Needs assistance Sitting-balance support: Feet unsupported;Feet supported;No upper extremity supported Sitting balance-Leahy Scale: Good     Standing balance support: During functional activity;Bilateral upper extremity supported Standing balance-Leahy Scale: Fair Standing balance comment: able to static stand without UE support                            Cognition Arousal/Alertness: Awake/alert Behavior During Therapy: WFL for tasks assessed/performed Overall Cognitive Status: Within Functional Limits for tasks assessed Area of Impairment: Problem solving;Following commands                             Problem Solving: Requires verbal cues        Exercises General Exercises - Upper Extremity Shoulder Flexion: AROM;Both;10 reps;Seated Elbow Flexion: AROM;Both;10 reps;Seated Elbow Extension: AROM;Both;10 reps;Seated General Exercises - Lower Extremity Long Arc Quad: AROM;Both;10 reps;Seated Hip ABduction/ADduction: AROM;Both;10 reps;Seated Hip Flexion/Marching: AROM;Both;10 reps;Seated    General Comments General comments (skin integrity, edema, etc.): Ambulated on RA with SaO2 >92%O2      Pertinent Vitals/Pain Pain Assessment: No/denies pain           PT Goals (current goals can now be found in the care plan section) Acute Rehab PT Goals Patient Stated Goal: get back to doing things at her facility PT Goal Formulation: With patient Time For Goal Achievement: 11/15/19 Potential to Achieve Goals: Good Progress towards PT goals: Progressing toward goals    Frequency  Min 3X/week      PT Plan Current plan remains appropriate       AM-PAC PT "6 Clicks" Mobility   Outcome Measure  Help needed turning from your back to your side while in a flat bed without using bedrails?: None Help needed moving from lying on your  back to sitting on the side of a flat bed without using bedrails?: A Little Help needed moving to and from a bed to a chair (including a wheelchair)?: A Little Help needed standing up from a chair using your arms (e.g., wheelchair or bedside chair)?: A Little Help needed to walk in hospital room?: A Little Help needed climbing 3-5 steps with a railing? : A Lot 6 Click Score: 18    End of Session Equipment Utilized During Treatment: Gait belt Activity Tolerance: Patient tolerated treatment well Patient left: in chair;with call bell/phone within reach;with chair alarm set Nurse Communication: Mobility status PT Visit Diagnosis: Unsteadiness on feet (R26.81);Other abnormalities of gait and mobility (R26.89);Muscle weakness (generalized) (M62.81);Dizziness and giddiness (R42)     Time: [id number] PT Time Calculation (min) (ACUTE ONLY): 26 min  Charges:  $Gait Training: 8-22 mins $Therapeutic Exercise: 8-22 mins                     Jaculin Rasmus B. Migdalia Dk PT, DPT Acute Rehabilitation Services Pager [phone] Office [phone]    [place] 11/05/2019, 3:52 PM

## 2019-11-05 NOTE — Progress Notes (Signed)
SATURATION QUALIFICATIONS: (This note is used to comply with regulatory documentation for home oxygen)  Patient Saturations on Room Air at Rest = 92%  Patient Saturations on Room Air while Ambulating = 87%  Patient Saturations on 2 Liters of oxygen while Ambulating = 93%  Please briefly explain why patient needs home oxygen: Pt needs O2 while sitting, standing and ambulating in order to maintain saturations above 88%

## 2019-11-05 NOTE — Progress Notes (Signed)
Inpatient Diabetes Program Recommendations  AACE/ADA: New Consensus Statement on Inpatient Glycemic Control (2015)  Target Ranges:  Prepandial:   less than 140 mg/dL      Peak postprandial:   less than 180 mg/dL (1-2 hours)      Critically ill patients:  140 - 180 mg/dL   Lab Results  Component Value Date   GLUCAP 240 (H) 11/05/2019   HGBA1C 8.3 (H) 10/29/2019    Review of Glycemic Control Results for MILY, PEEPLES (MRN [id number]) as of 11/05/2019 13:08  Ref. Range 11/04/2019 08:12 11/04/2019 12:06 11/04/2019 16:36 11/04/2019 21:01 11/05/2019 08:19 11/05/2019 12:18  Glucose-Capillary Latest Ref Range: 70 - 99 mg/dL 203 (H) 315 (H) 258 (H) 185 (H) 252 (H) 240 (H)   Diabetes history: DM2 Outpatient Diabetes medications: Lantus 53 QHS, Novolog 7 BID with lunch and supper plus SSI, glipizide XL 10 BID Current orders for Inpatient glycemic control: Lantus 40 units qd + Novolog 5 units tid meal coverage + Novolog moderate correction tid  Inpatient Diabetes Program Recommendations:   -Lantus to 45 units -Increase Novolog meal coverage to 7 units tid if eats 50% -Add hs correction scale 0-5 units  Thank you, Bethena Roys E. Yonael Tulloch, RN, MSN, CDE  Diabetes Coordinator Inpatient Glycemic Control Team Team Pager [phone] (8am-5pm) 11/05/2019 1:14 PM

## 2019-11-05 NOTE — Discharge Summary (Addendum)
Physician Discharge Summary  Kathy Caldwell [id number] DOB: 12-Apr-1946 DOA: 10/29/2019  PCP: Patient, No Pcp Per  Admit date: 10/29/2019 Discharge date: 11/05/2019  Admitted From: ALF Disposition:  ALF  Recommendations for Outpatient Follow-up:  1. Follow up with PCP in 1-2 weeks 2. Please obtain CMP/CBC, Mag, Phos in one week 3. Repeat CXR in 3-6 weeks 4. Please follow up on the following pending results:  Home Health: No Equipment/Devices: Supplemental O2 via [place]    Discharge Condition: Stable CODE STATUS: FULL CODE  Diet recommendation: Heart healthy carb modified diet  Brief/Interim Summary: The Patient is a 74 year old Caucasian female with a past medical history significant for but not limited to type 2 diabetes mellitus, hypertension, anxiety and depression, as well as other comorbidities who tested positive for COVID-19 disease several days ago seen at [facility] on 10/26/2019.  She had a chest CT which was negative and she was not hypoxic then but then started having worsening symptoms.  Has had loss of taste but not her smell.  Had an associated cough and she was saturating 84% and sent in by EMS on 10/29/2019.  In the ED she was saturating 86% with ambulation on room air and then required 2 L of supplemental oxygen via nasal cannula to maintain her O2 saturation greater 90%.  Admitted for acute hypoxic respiratory failure secondary to COVID-19 disease and has improved significantly.  She was weaned off of oxygen and did not desaturate yesterday however she required nocturnal oxygen and did desaturate today on ambulatory home O2 Screen so she will be written oxygen at discharge.  She is status post remdesivir and will continue steroid course and completion.  She will be sent out on inhalers and antitussives.  She will need to see PCP within 1 to 2 weeks after discharge she is improved.  Discharge Diagnoses:  Principal Problem:   Acute hypoxemic respiratory failure due to COVID-19  [facility]) Active Problems:   HTN (hypertension)   DM2 (diabetes mellitus, type 2) (HCC)   AKI (acute kidney injury) ([place])   Anxiety and depression  Acute Hypoxic Respiratory failure secondary to COVID-19 -Ad[facility] to [facility]  -InitialCXR showed"Very mild atelectasis is seen within the left lung base. There is no evidence of acute infiltrate, pleural effusion or pneumothorax. The heart size and mediastinal contours are within normal limits. The visualized skeletal structures are unremarkable" -Checked Baseline Inflammatory Markers and Trend Daily: Recent Labs    11/03/19 0807 11/04/19 0227 11/04/19 0614 11/05/19 0800  DDIMER 1.06*  --  1.00* 1.06*  FERRITIN  --   --  15 13  LDH  --  190  --  158  CRP 1.2*  --  0.7 0.6  -Fibrinogen went from 470 -> 478  No results found for: SARSCOV2NAA -SpO2: 92 % O2 Flow Rate (L/min): 2 L/min; social work she needs to be under 4 L prior to being discharged back to her skilled nursing facility -She has been intermittently wearing supplemental oxygen and has been on at least 2 L.  Did Home Ambulatory Home O2 Screen and Did not desaturate yesterday but did desaturate at night when sleeping and today so will need Home O2 -Started Treatment with 5 Days of Remdesivir and 10 Days of IV Decadron; Completed Remdesivir and is on Day 7/10 of Steroids and will take 3 more days of po Dexamethasone  -C/w Antitussives withGuaifenesin-Dextromethorphan 10 mL po q4hprn Cough at D/C; Stopped  Chlorpheniramine-Hydrocodone 5 mL po q12hprn -Check Blood Cx x2 and  showed NGTD at 5 Days -Continuing Azithromycin 250 mg po Daily x 4 more Doses this was completed yesterday  -C/w Zinc 220 mg po Daily and Vitamin C 500 mg po Daily  -Airborne and Contact Precautions -C/w CombiventScheduled1 puff IH q6hand Albuterol 2 puff IH q4hprn -Add Flutter Valve and Incentive Spirometry  -Repeat CXR 10/31/2019 showed "The heart size and mediastinal contours are within normal  limits. Aortic atherosclerosis. No effusions. Both lungs are clear. The visualized skeletal structures are unremarkable." -Repeat chest x-ray 11/04/2019 showed "Heart size remains normal. Aortic atherosclerosis. Minimal patchy infiltrate or atelectasis at the lung bases. No dense consolidation or lobar collapse. No effusions." -Inflammatory markers have improved as well as chest x-ray imaging -Stable to go back to ALF   Type 2 diabetes with hyperglycemia in setting of steroids -Continue Lantus 40 units nightly and Moderate Novolog SSI AC -Added Novolog 5 u three times daily with meals -CBG's ranging from 185-240 -Expect CBG's to worsen in the setting of Steroid Demargination; Further adjustments per PCP   AKI on CKD Stage 2 -Unknown baseline -Improved with IV fluids but now BUN/Cr went form 40/1.35 -> 43/1.75 -> 39/1.21 -Continue Lisinopril at D/C but resumed Lasix and had worsening Renal Fxn; Continue to Hold Lasix again today and resume at D/C  -Continue to Monitor Strict I's and O's  Hypertension -Continue Amlodipine 10 mg po Daily  -C/w Carvedilol 6.25 mg po BID for tachycardia and hypertension -Resume Lisinopril at D/C -Resumed Lasix 20 mg at D/C -BP was elevated today   Suspect COPD exacerbation without history of diagnosed COPD but has history of tobacco use  -ContinuedAzithromycin atypical coverage -Inhalers as above as well as Steroids  -Repeat CXR as above -Follow up with PCP within 1-2 weeks  -Needs Pulmonary Evaluation for official PFTs  Hypomagnesemia -Patient's Mag Level was 1.8 -Replete with IV Mag Sulfate 2 grams yesterday -Continue to Monitor and Replete as Necessary -Repeat Mag Level in the AM   GERD -C/w Pantoprazole 80 mg po Daily  Anxiety/Depression/SchizoAffective  -C/w Lorazepam 0.25 mg po BIDprn Anxiety and Agitation and C/w Lorazepam 0.25 mg Daily  -C/w Mirtazapine 45 m po qHS -C/w Risperidone 0.25 mg po qHS, Venlafaxine XR 150 mg po Daily  and Trazodone 100 mg po qHS   Leukocytosis -In the setting of steroid demargination -Patient WBC went from 11.8 -> 12.4 -> 13.1 -Continue monitor and trend -Completed course of antibiotics -Repeat CBC in a.m.  Hyponatremia, mild  -Likely in the setting of furosemide usage -Continue to hold furosemide for now and resume in AM  -Patient sodium went from 140 is now 134  -Continue monitor and trend and repeat CMP in a.m.  Normocytic Anemia -Patient's Hemoglobin/Hematocrit went from 11.6/37 -> 10.7/35.6 -> 11.7/37.7 -Check anemia panel in a.m. -Continue to monitor for signs and symptoms of bleeding; currently no overt bleeding noted -Repeat CBC in a.m.   Discharge Instructions  Discharge Instructions    Call MD for:  difficulty breathing, headache or visual disturbances   Complete by: As directed    Call MD for:  extreme fatigue   Complete by: As directed    Call MD for:  hives   Complete by: As directed    Call MD for:  persistant dizziness or light-headedness   Complete by: As directed    Call MD for:  persistant nausea and vomiting   Complete by: As directed    Call MD for:  redness, tenderness, or signs of infection (pain, swelling, redness, odor  or green/yellow discharge around incision site)   Complete by: As directed    Call MD for:  severe uncontrolled pain   Complete by: As directed    Call MD for:  temperature >100.4   Complete by: As directed    Diet - low sodium heart healthy   Complete by: As directed    Discharge instructions   Complete by: As directed    You were cared for by a hospitalist during your hospital stay. If you have any questions about your discharge medications or the care you received while you were in the hospital after you are discharged, you can call the unit and ask to speak with the hospitalist on call if the hospitalist that took care of you is not available. Once you are discharged, your primary care physician will handle any further  medical issues. Please note that NO REFILLS for any discharge medications will be authorized once you are discharged, as it is imperative that you return to your primary care physician (or establish a relationship with a primary care physician if you do not have one) for your aftercare needs so that they can reassess your need for medications and monitor your lab values.  Follow up with PCP within 1 week and repeat CXR in 3-6 weeks. Take all medications as prescribed. If symptoms change or worsen please return to the ED for evaluation   Increase activity slowly   Complete by: As directed      Allergies as of 11/05/2019   No Known Allergies     Medication List    STOP taking these medications   insulin glargine 100 unit/mL Sopn Commonly known as: LANTUS Replaced by: insulin glargine 100 UNIT/ML injection     TAKE these medications   acetaminophen 325 MG tablet Commonly known as: TYLENOL Take 650 mg by mouth 4 (four) times daily.   albuterol 108 (90 Base) MCG/ACT inhaler Commonly known as: VENTOLIN HFA Inhale 2 puffs into the lungs every 4 (four) hours as needed for wheezing or shortness of breath.   allopurinol 100 MG tablet Commonly known as: ZYLOPRIM Take 100 mg by mouth daily.   amLODipine 10 MG tablet Commonly known as: NORVASC Take 10 mg by mouth daily.   aspirin EC 81 MG tablet Take 81 mg by mouth daily.   BIOFREEZE EX Apply 1 application topically See admin instructions. Apply topically to left shoulder daily as needed for pain   carvedilol 6.25 MG tablet Commonly known as: COREG Take 1 tablet (6.25 mg total) by mouth 2 (two) times daily with a meal.   cholecalciferol 25 MCG (1000 UNIT) tablet Commonly known as: VITAMIN D3 Take 1,000 Units by mouth daily.   Culturelle Caps Take 1 capsule by mouth daily.   dexamethasone 6 MG tablet Commonly known as: DECADRON Take 1 tablet (6 mg total) by mouth daily.   fluticasone 44 MCG/ACT inhaler Commonly known as:  FLOVENT HFA Inhale 1 puff into the lungs 2 (two) times daily.   furosemide 20 MG tablet Commonly known as: LASIX Take 20 mg by mouth daily.   glipiZIDE 10 MG 24 hr tablet Commonly known as: GLUCOTROL XL Take 10 mg by mouth 2 (two) times daily.   guaiFENesin-dextromethorphan 100-10 MG/5ML syrup Commonly known as: ROBITUSSIN DM Take 10 mLs by mouth every 4 (four) hours as needed for cough.   insulin glargine 100 UNIT/ML injection Commonly known as: LANTUS Inject 0.4 mLs (40 Units total) into the skin at bedtime. Replaces: insulin glargine  100 unit/mL Sopn   Ipratropium-Albuterol 20-100 MCG/ACT Aers respimat Commonly known as: COMBIVENT Inhale 1 puff into the lungs 3 (three) times daily.   lisinopril 20 MG tablet Commonly known as: ZESTRIL Take 1 tablet (20 mg total) by mouth daily. What changed:   how much to take  when to take this   loperamide 2 MG tablet Commonly known as: IMODIUM A-D Take 2-4 mg by mouth See admin instructions. Take 2 tablets (4 mg) by mouth after 1st loose stool, then take 1 tablet (2 mg) after each loose stool (no more than 4 tabs in 24 hours)   LORazepam 0.5 MG tablet Commonly known as: ATIVAN Take 0.5 tablets (0.25 mg total) by mouth See admin instructions. Take 1/2 tablet (0.25 mg) by mouth daily, may also take 1/2 tablet (0.25 mg) every 12 hours as needed for anxiety/agitation   Melatonin 3 MG Tabs Take 6 mg by mouth at bedtime as needed (insomnia).   menthol-cetylpyridinium 3 MG lozenge Commonly known as: CEPACOL Take 1 lozenge (3 mg total) by mouth as needed for sore throat.   mirtazapine 45 MG tablet Commonly known as: REMERON Take 45 mg by mouth at bedtime.   NovoLOG FlexPen 100 UNIT/ML FlexPen Generic drug: insulin aspart Inject 0-10 Units into the skin See admin instructions. Inject 7 units subcutaneously twice daily at lunch and supper plus 0-10 units  before meals and at bedtime per sliding scale: CBG 0-151 0 units, 151-200 1  units, 201-250 2 units, 251-300 4 units, 301-350 6 units, 351-400 8 units, 401-450 10 units, >450 give 10 units and call MD   omeprazole 40 MG capsule Commonly known as: PRILOSEC Take 40 mg by mouth daily.   ondansetron 4 MG tablet Commonly known as: ZOFRAN Take 1 tablet (4 mg total) by mouth every 6 (six) hours as needed for nausea.   OVER THE COUNTER MEDICATION Apply 1 application topically See admin instructions. Preparation H cream - apply topically to external hemorrhoids daily as needed for pain   risperiDONE 0.25 MG tablet Commonly known as: RISPERDAL Take 0.25 mg by mouth at bedtime.   simvastatin 20 MG tablet Commonly known as: ZOCOR Take 20 mg by mouth at bedtime.   SYSTANE OP Place 1 drop into both eyes 2 (two) times daily.   traZODone 100 MG tablet Commonly known as: DESYREL Take 100 mg by mouth at bedtime.   Unifiber Powd Generic drug: Cellulose Take 15 mLs by mouth daily. Mix in 8 oz water and drink   venlafaxine XR 150 MG 24 hr capsule Commonly known as: EFFEXOR-XR Take 150 mg by mouth daily.   Vitamin C 500 MG Caps Take 500 mg by mouth daily.   Zinc 50 MG Tabs Take 50 mg by mouth daily.            Durable Medical Equipment  (From admission, onward)         Start     Ordered   11/05/19 1235  For home use only DME oxygen  Once    Question Answer Comment  Length of Need 6 Months   Mode or (Route) Nasal cannula   Liters per Minute 2   Frequency Continuous (stationary and portable oxygen unit needed)   Oxygen conserving device No   Oxygen delivery system Gas      11/05/19 1234          No Known Allergies  Consultations:  None  Procedures/Studies: DG CHEST PORT 1 VIEW  Result Date: 11/04/2019 CLINICAL  DATA:  Shortness of breath.  Coronavirus infection. EXAM: PORTABLE CHEST 1 VIEW COMPARISON:  10/31/2019 FINDINGS: Heart size remains normal. Aortic atherosclerosis. Minimal patchy infiltrate or atelectasis at the lung bases. No dense  consolidation or lobar collapse. No effusions. IMPRESSION: Minimal patchy infiltrate or atelectasis at the lung bases. Otherwise clear chest. Electronically Signed   By: Nelson Chimes M.D.   On: 11/04/2019 08:27   DG CHEST PORT 1 VIEW  Result Date: 10/31/2019 CLINICAL DATA:  Shortness of breath. EXAM: PORTABLE CHEST 1 VIEW COMPARISON:  Chest x-rays dated 10/29/2019 and 10/26/2019 and chest CT dated 10/26/2019 FINDINGS: The heart size and mediastinal contours are within normal limits. Aortic atherosclerosis. No effusions. Both lungs are clear. The visualized skeletal structures are unremarkable. IMPRESSION: 1. No active cardiopulmonary disease. 2.  Aortic Atherosclerosis (ICD10-I70.0). Electronically Signed   By: Lorriane Shire M.D.   On: 10/31/2019 11:39   DG Chest Port 1 View  Result Date: 10/29/2019 CLINICAL DATA:  Hypoxia. EXAM: PORTABLE CHEST 1 VIEW COMPARISON:  None. FINDINGS: Very mild atelectasis is seen within the left lung base. There is no evidence of acute infiltrate, pleural effusion or pneumothorax. The heart size and mediastinal contours are within normal limits. The visualized skeletal structures are unremarkable. IMPRESSION: 1. Very mild left basilar atelectasis. Electronically Signed   By: Virgina Norfolk M.D.   On: 10/29/2019 18:53     Subjective: And examined at bedside she states that she is doing well and denies any chest pain, shortness breath, lightheadedness or dizziness.  No nausea or vomiting.  No other concerns or complaints at this time and is ready to be discharged back to ALF and will follow up with PCP.  She will need outpatient referral for official PFTs.  Discharge Exam: Vitals:   11/05/19 0547 11/05/19 0930  BP: (!) 171/85   Pulse: 87 94  Resp: 14 15  Temp: 97.9 F (36.6 C)   SpO2: 93% 92%   Vitals:   11/04/19 1627 11/04/19 2022 11/05/19 0547 11/05/19 0930  BP:  (!) 141/73 (!) 171/85   Pulse: 89 83 87 94  Resp: 18 14 14 15   Temp:  98 F (36.7 C) 97.9 F  (36.6 C)   TempSrc:  Oral    SpO2: 94% 90% 93% 92%  Weight:      Height:       General: Pt is alert, awake, not in acute distress; she is very pleasant and does have a hoarse voice today Cardiovascular: RRR, S1/S2 +, no rubs, no gallops Respiratory: Diminished bilaterally with coarse breath sounds and slight wheezing; unlabored breathing and not wearing any supplemental oxygen via nasal cannula Abdominal: Soft, NT, ND, bowel sounds + Extremities: Trace edema, no cyanosis  The results of significant diagnostics from this hospitalization (including imaging, microbiology, ancillary and laboratory) are listed below for reference.    Microbiology: Recent Results (from the past 240 hour(s))  Blood Culture (routine x 2)     Status: None   Collection Time: 10/29/19  7:04 PM   Specimen: BLOOD LEFT HAND  Result Value Ref Range Status   Specimen Description BLOOD LEFT HAND  Final   Special Requests   Final    BOTTLES DRAWN AEROBIC AND ANAEROBIC Blood Culture adequate volume   Culture   Final    NO GROWTH 5 DAYS Performed at [facility], [address] [place], [place] [address]    Report Status 11/03/2019 FINAL  Final  Blood Culture (routine x 2)  Status: None   Collection Time: 10/29/19  7:06 PM   Specimen: BLOOD LEFT FOREARM  Result Value Ref Range Status   Specimen Description BLOOD LEFT FOREARM  Final   Special Requests   Final    BOTTLES DRAWN AEROBIC AND ANAEROBIC Blood Culture adequate volume   Culture   Final    NO GROWTH 5 DAYS Performed at [facility], [address], [place], [place] [address]    Report Status 11/03/2019 FINAL  Final    Labs: BNP (last 3 results) No results for input(s): BNP in the last 8760 hours. Basic Metabolic Panel: Recent Labs  Lab 10/31/19 0500 10/31/19 0500 11/01/19 0816 11/02/19 0826 11/03/19 0807 11/04/19 0227 11/05/19 0800  NA 134*   < > 137 137 140 134* 134*  K 5.0   < > 4.6 4.5 5.0 4.4 4.7  CL 103   < > 106 106  105 101 98  CO2 21*   < > 21* 24 26 25 25   GLUCOSE 333*   < > 222* 87 79 273* 277*  BUN 30*   < > 38* 36* 40* 43* 39*  CREATININE 1.48*   < > 1.42* 1.35* 1.35* 1.75* 1.21*  CALCIUM 8.4*   < > 8.5* 8.4* 8.8* 8.1* 8.7*  MG 1.3*  --   --  1.6* 1.6* 1.5* 1.8  PHOS  --   --   --   --   --  3.1 4.2   < > = values in this interval not displayed.   Liver Function Tests: Recent Labs  Lab 10/30/19 0609 10/31/19 0500 11/01/19 0816 11/04/19 0227 11/05/19 0800  AST 15 16 20 15  12*  ALT 14 13 15 14 14   ALKPHOS 71 73 82 75 81  BILITOT 0.3 0.2* 0.3 0.5 0.7  PROT 5.5* 5.7* 5.7* 5.1* 5.7*  ALBUMIN 2.7* 2.8* 2.7* 2.6* 2.8*   No results for input(s): LIPASE, AMYLASE in the last 168 hours. No results for input(s): AMMONIA in the last 168 hours. CBC: Recent Labs  Lab 11/01/19 0816 11/02/19 0826 11/03/19 0807 11/04/19 0227 11/05/19 0800  WBC 13.9* 10.4 11.8* 12.4* 13.1*  NEUTROABS 12.7* 9.1* 10.0* 10.1* 11.8*  HGB 10.6* 10.8* 11.6* 10.7* 11.7*  HCT 34.5* 35.8* 37.9 35.6* 37.7  MCV 85.2 85.6 84.6 85.2 83.0  PLT 248 268 291 268 333   Cardiac Enzymes: No results for input(s): CKTOTAL, CKMB, CKMBINDEX, TROPONINI in the last 168 hours. BNP: Invalid input(s): POCBNP CBG: Recent Labs  Lab 11/04/19 1206 11/04/19 1636 11/04/19 2101 11/05/19 0819 11/05/19 1218  GLUCAP 315* 258* 185* 252* 240*   D-Dimer Recent Labs    11/04/19 0614 11/05/19 0800  DDIMER 1.00* 1.06*   Hgb A1c No results for input(s): HGBA1C in the last 72 hours. Lipid Profile No results for input(s): CHOL, HDL, LDLCALC, TRIG, CHOLHDL, LDLDIRECT in the last 72 hours. Thyroid function studies No results for input(s): TSH, T4TOTAL, T3FREE, THYROIDAB in the last 72 hours.  Invalid input(s): FREET3 Anemia work up [employer]    11/04/19 0614 11/05/19 0800  FERRITIN 15 13   Urinalysis No results found for: COLORURINE, APPEARANCEUR, LABSPEC, [place], [place], [place], [place], [place], [place],  UROBILINOGEN, NITRITE, LEUKOCYTESUR Sepsis Labs Invalid input(s): PROCALCITONIN,  WBC,  LACTICIDVEN Microbiology Recent Results (from the past 240 hour(s))  Blood Culture (routine x 2)     Status: None   Collection Time: 10/29/19  7:04 PM   Specimen: BLOOD LEFT HAND  Result Value Ref Range Status  Specimen Description BLOOD LEFT HAND  Final   Special Requests   Final    BOTTLES DRAWN AEROBIC AND ANAEROBIC Blood Culture adequate volume   Culture   Final    NO GROWTH 5 DAYS Performed at [facility], [address] [place], [place] [address]    Report Status 11/03/2019 FINAL  Final  Blood Culture (routine x 2)     Status: None   Collection Time: 10/29/19  7:06 PM   Specimen: BLOOD LEFT FOREARM  Result Value Ref Range Status   Specimen Description BLOOD LEFT FOREARM  Final   Special Requests   Final    BOTTLES DRAWN AEROBIC AND ANAEROBIC Blood Culture adequate volume   Culture   Final    NO GROWTH 5 DAYS Performed at [facility], [place] [address] [place], [place] [address]    Report Status 11/03/2019 FINAL  Final   Time coordinating discharge: 35 minutes  SIGNED:  Kerney Elbe, DO [facility] 11/05/2019, 1:00 PM Pager is on [place]  If 7PM-7AM, please contact night-coverage [email or web address] Password [email or web address]

## 2019-11-05 NOTE — TOC Initial Note (Signed)
Transition of Care [facility]) - Initial/Assessment Note    Patient Details  Name: Kathy Caldwell MRN: [id number] Date of Birth: [birth year]  [facility]) CM/SW Contact:    Benard Halsted, LCSW Phone Number: 11/05/2019, 2:47 PM  Clinical Narrative:                 Patient very pleasant and reports agreement with plan to return to [place]. CSW setting up oxygen with [employer] prior to calling [facility].   Expected Discharge Plan: Assisted Living Barriers to Discharge: No Barriers Identified   Patient Goals and CMS Choice Patient states their goals for this hospitalization and ongoing recovery are:: Return to ALF CMS Medicare.gov Compare Post Acute Care list provided to:: Patient Choice offered to / list presented to : Patient  Expected Discharge Plan and Services Expected Discharge Plan: Assisted Living In-house Referral: Clinical Social Work   Post Acute Care Choice: [place] arrangements for the past 2 months: [place] Expected Discharge Date: 11/05/19                                    Prior Living Arrangements/Services Living arrangements for the past 2 months: [place] Lives with:: Facility Resident Patient language and need for interpreter reviewed:: Yes Do you feel safe going back to the place where you live?: Yes      Need for Family Participation in Patient Care: No (Comment) Care giver support system in place?: Yes (comment) Current home services: DME Criminal Activity/Legal Involvement Pertinent to Current Situation/Hospitalization: No - Comment as needed  Activities of Daily Living      Permission Sought/Granted Permission sought to share information with : Facility Art therapist granted to share information with : Yes, Verbal Permission Granted     Permission granted to share info w AGENCY: ALF        Emotional Assessment Appearance:: Appears stated age Attitude/Demeanor/Rapport:  Engaged Affect (typically observed): Accepting, Appropriate Orientation: : Oriented to Self, Oriented to Place, Oriented to  Time, Oriented to Situation Alcohol / Substance Use: Not Applicable Psych Involvement: No (comment)  Admission diagnosis:  Shortness of breath [R06.02] Acute kidney injury ([place]) [N17.9] Acute hypoxemic respiratory failure due to COVID-19 ([place]) [U07.1, J96.01] COVID-19 [U07.1] Patient Active Problem List   Diagnosis Date Noted  . HTN (hypertension) 10/29/2019  . DM2 (diabetes mellitus, type 2) ([place]) 10/29/2019  . AKI (acute kidney injury) ([place]) 10/29/2019  . Acute hypoxemic respiratory failure due to COVID-19 ([place]) 10/29/2019  . Anxiety and depression 10/29/2019   PCP:  Patient, No Pcp Per Pharmacy:  No Pharmacies Listed    Social Determinants of Health (SDOH) Interventions    Readmission Risk Interventions No flowsheet data found.

## 2019-11-05 NOTE — NC FL2 (Signed)
[place] LEVEL OF CARE SCREENING TOOL     IDENTIFICATION  Patient Name: Kathy Caldwell Birthdate: 1946/01/23 Sex: female Admission Date (Current Location): 10/29/2019  [facility] and [place] Number:  [employer] and Address:  [facility], [place] [address] [place], [place] [address]      Provider Number: [id number]  Attending Physician Name and Address:  Kerney Elbe, DO  Relative Name and Phone Number:       Current Level of Care: Hospital Recommended Level of Care: [place] Prior Approval Number:    Date Approved/Denied:   PASRR Number:    Discharge Plan: Other (Comment)(ALF)    Current Diagnoses: Patient Active Problem List   Diagnosis Date Noted  . HTN (hypertension) 10/29/2019  . DM2 (diabetes mellitus, type 2) ([place]) 10/29/2019  . AKI (acute kidney injury) ([place]) 10/29/2019  . Acute hypoxemic respiratory failure due to COVID-19 ([place]) 10/29/2019  . Anxiety and depression 10/29/2019    Orientation RESPIRATION BLADDER Height & Weight     Self, Time, Situation, Place  O2(Nasal cannula 2L at night) Incontinent Weight: 153 lb 14.1 oz (69.8 kg) Height:  5\' 6"  (167.6 cm)  BEHAVIORAL SYMPTOMS/MOOD NEUROLOGICAL BOWEL NUTRITION STATUS      Incontinent Diet(Regular)  AMBULATORY STATUS COMMUNICATION OF NEEDS Skin   Supervision Verbally Normal                       Personal Care Assistance Level of Assistance  Bathing, Feeding, Dressing Bathing Assistance: Limited assistance Feeding assistance: Independent Dressing Assistance: Limited assistance     Functional Limitations Info  Sight, Hearing, Speech Sight Info: Adequate Hearing Info: Adequate Speech Info: Adequate    SPECIAL CARE FACTORS FREQUENCY  PT (By licensed PT), OT (By licensed OT)     PT Frequency: home health 3x/week OT Frequency: home health 3x/week            Contractures Contractures Info: Not present    Additional Factors  Info  Code Status, Allergies, Isolation Precautions Code Status Info: Full Allergies Info: NKA     Isolation Precautions Info: COVID +     Current Medications (11/05/2019):  Discharge Medications: STOP taking these medications       insulin glargine 100 unit/mL Sopn Commonly known as: LANTUS Replaced by: insulin glargine 100 UNIT/ML injection             TAKE these medications       acetaminophen 325 MG tablet Commonly known as: TYLENOL Take 650 mg by mouth 4 (four) times daily.   albuterol 108 (90 Base) MCG/ACT inhaler Commonly known as: VENTOLIN HFA Inhale 2 puffs into the lungs every 4 (four) hours as needed for wheezing or shortness of breath.   allopurinol 100 MG tablet Commonly known as: ZYLOPRIM Take 100 mg by mouth daily.   amLODipine 10 MG tablet Commonly known as: NORVASC Take 10 mg by mouth daily.   aspirin EC 81 MG tablet Take 81 mg by mouth daily.   BIOFREEZE EX Apply 1 application topically See admin instructions. Apply topically to left shoulder daily as needed for pain   carvedilol 6.25 MG tablet Commonly known as: COREG Take 1 tablet (6.25 mg total) by mouth 2 (two) times daily with a meal.   cholecalciferol 25 MCG (1000 UNIT) tablet Commonly known as: VITAMIN D3 Take 1,000 Units by mouth daily.   Culturelle Caps Take 1 capsule by mouth daily.   dexamethasone 6 MG  tablet Commonly known as: DECADRON Take 1 tablet (6 mg total) by mouth daily.   fluticasone 44 MCG/ACT inhaler Commonly known as: FLOVENT HFA Inhale 1 puff into the lungs 2 (two) times daily.   furosemide 20 MG tablet Commonly known as: LASIX Take 20 mg by mouth daily.   glipiZIDE 10 MG 24 hr tablet Commonly known as: GLUCOTROL XL Take 10 mg by mouth 2 (two) times daily.   guaiFENesin-dextromethorphan 100-10 MG/5ML syrup Commonly known as: ROBITUSSIN DM Take 10 mLs by mouth every 4 (four) hours as needed for cough.   insulin glargine 100 UNIT/ML  injection Commonly known as: LANTUS Inject 0.4 mLs (40 Units total) into the skin at bedtime. Replaces: insulin glargine 100 unit/mL Sopn   Ipratropium-Albuterol 20-100 MCG/ACT Aers respimat Commonly known as: COMBIVENT Inhale 1 puff into the lungs 3 (three) times daily.   lisinopril 20 MG tablet Commonly known as: ZESTRIL Take 1 tablet (20 mg total) by mouth daily. What changed:   how much to take  when to take this   loperamide 2 MG tablet Commonly known as: IMODIUM A-D Take 2-4 mg by mouth See admin instructions. Take 2 tablets (4 mg) by mouth after 1st loose stool, then take 1 tablet (2 mg) after each loose stool (no more than 4 tabs in 24 hours)   LORazepam 0.5 MG tablet Commonly known as: ATIVAN Take 0.5 tablets (0.25 mg total) by mouth See admin instructions. Take 1/2 tablet (0.25 mg) by mouth daily, may also take 1/2 tablet (0.25 mg) every 12 hours as needed for anxiety/agitation   Melatonin 3 MG Tabs Take 6 mg by mouth at bedtime as needed (insomnia).   menthol-cetylpyridinium 3 MG lozenge Commonly known as: CEPACOL Take 1 lozenge (3 mg total) by mouth as needed for sore throat.   mirtazapine 45 MG tablet Commonly known as: REMERON Take 45 mg by mouth at bedtime.   NovoLOG FlexPen 100 UNIT/ML FlexPen Generic drug: insulin aspart Inject 0-10 Units into the skin See admin instructions. Inject 7 units subcutaneously twice daily at lunch and supper plus 0-10 units  before meals and at bedtime per sliding scale: CBG 0-151 0 units, 151-200 1 units, 201-250 2 units, 251-300 4 units, 301-350 6 units, 351-400 8 units, 401-450 10 units, >450 give 10 units and call MD   omeprazole 40 MG capsule Commonly known as: PRILOSEC Take 40 mg by mouth daily.   ondansetron 4 MG tablet Commonly known as: ZOFRAN Take 1 tablet (4 mg total) by mouth every 6 (six) hours as needed for nausea.   OVER THE COUNTER MEDICATION Apply 1 application topically See admin instructions.  Preparation H cream - apply topically to external hemorrhoids daily as needed for pain   risperiDONE 0.25 MG tablet Commonly known as: RISPERDAL Take 0.25 mg by mouth at bedtime.   simvastatin 20 MG tablet Commonly known as: ZOCOR Take 20 mg by mouth at bedtime.   SYSTANE OP Place 1 drop into both eyes 2 (two) times daily.   traZODone 100 MG tablet Commonly known as: DESYREL Take 100 mg by mouth at bedtime.   Unifiber Powd Generic drug: Cellulose Take 15 mLs by mouth daily. Mix in 8 oz water and drink   venlafaxine XR 150 MG 24 hr capsule Commonly known as: EFFEXOR-XR Take 150 mg by mouth daily.   Vitamin C 500 MG Caps Take 500 mg by mouth daily.   Zinc 50 MG Tabs Take 50 mg by mouth daily.  Relevant Imaging Results:  Relevant Lab Results:   Additional Information SSN: [id number]  [place], [place]

## 2019-11-08 DIAGNOSIS — J449 Chronic obstructive pulmonary disease, unspecified: Secondary | ICD-10-CM | POA: Diagnosis not present

## 2019-11-08 DIAGNOSIS — I10 Essential (primary) hypertension: Secondary | ICD-10-CM | POA: Diagnosis not present

## 2019-11-08 DIAGNOSIS — U071 COVID-19: Secondary | ICD-10-CM | POA: Diagnosis not present

## 2019-11-08 DIAGNOSIS — E1165 Type 2 diabetes mellitus with hyperglycemia: Secondary | ICD-10-CM | POA: Diagnosis not present

## 2019-11-08 DIAGNOSIS — M6281 Muscle weakness (generalized): Secondary | ICD-10-CM | POA: Diagnosis not present

## 2019-11-08 DIAGNOSIS — J9610 Chronic respiratory failure, unspecified whether with hypoxia or hypercapnia: Secondary | ICD-10-CM | POA: Diagnosis not present

## 2019-11-09 LAB — PATHOLOGIST SMEAR REVIEW

## 2019-11-10 ENCOUNTER — Other Ambulatory Visit: Payer: Self-pay

## 2019-11-10 NOTE — Patient Outreach (Signed)
[facility]) Care Management  11/10/2019  Kelina Dorcely 1946-03-29 [id number]     Transition of Care Referral  Referral Date: 11/09/2019 Referral Source: [facility] Discharge Report Date of Discharge: 11/06/2019 Facility: [place]: [facility] Medicare  Upon chart review noted in chart that patient resident at [facility] and was discharged back to facility.     Plan: RN CM will close case at this time.    Enzo Montgomery, RN,BSN,CCM [place] Management Telephonic Care Management Coordinator Direct Phone: [phone] Toll Free: [phone] Fax: [phone]

## 2019-11-25 DIAGNOSIS — R54 Age-related physical debility: Secondary | ICD-10-CM | POA: Diagnosis not present

## 2019-11-25 DIAGNOSIS — F419 Anxiety disorder, unspecified: Secondary | ICD-10-CM | POA: Diagnosis not present

## 2019-11-25 DIAGNOSIS — R451 Restlessness and agitation: Secondary | ICD-10-CM | POA: Diagnosis not present

## 2019-11-29 DIAGNOSIS — E612 Magnesium deficiency: Secondary | ICD-10-CM | POA: Diagnosis not present

## 2019-11-29 DIAGNOSIS — I509 Heart failure, unspecified: Secondary | ICD-10-CM | POA: Diagnosis not present

## 2019-11-29 DIAGNOSIS — N289 Disorder of kidney and ureter, unspecified: Secondary | ICD-10-CM | POA: Diagnosis not present

## 2019-11-29 DIAGNOSIS — R0989 Other specified symptoms and signs involving the circulatory and respiratory systems: Secondary | ICD-10-CM | POA: Diagnosis not present

## 2019-11-29 DIAGNOSIS — D649 Anemia, unspecified: Secondary | ICD-10-CM | POA: Diagnosis not present

## 2019-11-29 DIAGNOSIS — U071 COVID-19: Secondary | ICD-10-CM | POA: Diagnosis not present

## 2019-12-28 DIAGNOSIS — R634 Abnormal weight loss: Secondary | ICD-10-CM | POA: Diagnosis not present

## 2019-12-28 DIAGNOSIS — I10 Essential (primary) hypertension: Secondary | ICD-10-CM | POA: Diagnosis not present

## 2019-12-28 DIAGNOSIS — E119 Type 2 diabetes mellitus without complications: Secondary | ICD-10-CM | POA: Diagnosis not present

## 2020-02-01 DIAGNOSIS — L609 Nail disorder, unspecified: Secondary | ICD-10-CM | POA: Diagnosis not present

## 2020-02-18 DIAGNOSIS — R451 Restlessness and agitation: Secondary | ICD-10-CM | POA: Diagnosis not present

## 2020-02-18 DIAGNOSIS — F341 Dysthymic disorder: Secondary | ICD-10-CM | POA: Diagnosis not present

## 2020-02-18 DIAGNOSIS — F419 Anxiety disorder, unspecified: Secondary | ICD-10-CM | POA: Diagnosis not present

## 2020-02-18 DIAGNOSIS — F062 Psychotic disorder with delusions due to known physiological condition: Secondary | ICD-10-CM | POA: Diagnosis not present

## 2020-03-10 DIAGNOSIS — E119 Type 2 diabetes mellitus without complications: Secondary | ICD-10-CM | POA: Diagnosis not present

## 2020-03-10 DIAGNOSIS — R54 Age-related physical debility: Secondary | ICD-10-CM | POA: Diagnosis not present

## 2020-03-10 DIAGNOSIS — F341 Dysthymic disorder: Secondary | ICD-10-CM | POA: Diagnosis not present

## 2020-03-10 DIAGNOSIS — F419 Anxiety disorder, unspecified: Secondary | ICD-10-CM | POA: Diagnosis not present

## 2020-03-10 DIAGNOSIS — R635 Abnormal weight gain: Secondary | ICD-10-CM | POA: Diagnosis not present

## 2020-03-22 DIAGNOSIS — R54 Age-related physical debility: Secondary | ICD-10-CM | POA: Diagnosis not present

## 2020-03-22 DIAGNOSIS — R05 Cough: Secondary | ICD-10-CM | POA: Diagnosis not present

## 2020-03-22 DIAGNOSIS — R0602 Shortness of breath: Secondary | ICD-10-CM | POA: Diagnosis not present

## 2020-03-22 DIAGNOSIS — R0902 Hypoxemia: Secondary | ICD-10-CM | POA: Diagnosis not present

## 2020-03-24 DIAGNOSIS — S5011XA Contusion of right forearm, initial encounter: Secondary | ICD-10-CM | POA: Diagnosis not present

## 2020-03-24 DIAGNOSIS — F419 Anxiety disorder, unspecified: Secondary | ICD-10-CM | POA: Diagnosis not present

## 2020-03-24 DIAGNOSIS — F341 Dysthymic disorder: Secondary | ICD-10-CM | POA: Diagnosis not present

## 2020-03-24 DIAGNOSIS — F0390 Unspecified dementia without behavioral disturbance: Secondary | ICD-10-CM | POA: Diagnosis not present

## 2020-03-24 DIAGNOSIS — S50811A Abrasion of right forearm, initial encounter: Secondary | ICD-10-CM | POA: Diagnosis not present

## 2020-03-24 DIAGNOSIS — E1165 Type 2 diabetes mellitus with hyperglycemia: Secondary | ICD-10-CM | POA: Diagnosis not present

## 2020-03-29 DIAGNOSIS — Z6828 Body mass index (BMI) 28.0-28.9, adult: Secondary | ICD-10-CM | POA: Diagnosis not present

## 2020-03-29 DIAGNOSIS — G894 Chronic pain syndrome: Secondary | ICD-10-CM | POA: Diagnosis not present

## 2020-03-29 DIAGNOSIS — N184 Chronic kidney disease, stage 4 (severe): Secondary | ICD-10-CM | POA: Diagnosis not present

## 2020-03-29 DIAGNOSIS — F0390 Unspecified dementia without behavioral disturbance: Secondary | ICD-10-CM | POA: Diagnosis not present

## 2020-03-29 DIAGNOSIS — E1122 Type 2 diabetes mellitus with diabetic chronic kidney disease: Secondary | ICD-10-CM | POA: Diagnosis not present

## 2020-03-29 DIAGNOSIS — M1990 Unspecified osteoarthritis, unspecified site: Secondary | ICD-10-CM | POA: Diagnosis not present

## 2020-03-29 DIAGNOSIS — I129 Hypertensive chronic kidney disease with stage 1 through stage 4 chronic kidney disease, or unspecified chronic kidney disease: Secondary | ICD-10-CM | POA: Diagnosis not present

## 2020-03-30 DIAGNOSIS — R54 Age-related physical debility: Secondary | ICD-10-CM | POA: Diagnosis not present

## 2020-03-30 DIAGNOSIS — Z20822 Contact with and (suspected) exposure to covid-19: Secondary | ICD-10-CM | POA: Diagnosis not present

## 2020-03-30 DIAGNOSIS — Z20828 Contact with and (suspected) exposure to other viral communicable diseases: Secondary | ICD-10-CM | POA: Diagnosis not present

## 2020-03-31 DIAGNOSIS — I10 Essential (primary) hypertension: Secondary | ICD-10-CM | POA: Diagnosis not present

## 2020-03-31 DIAGNOSIS — D649 Anemia, unspecified: Secondary | ICD-10-CM | POA: Diagnosis not present

## 2020-03-31 DIAGNOSIS — E119 Type 2 diabetes mellitus without complications: Secondary | ICD-10-CM | POA: Diagnosis not present

## 2020-03-31 DIAGNOSIS — E785 Hyperlipidemia, unspecified: Secondary | ICD-10-CM | POA: Diagnosis not present

## 2020-03-31 DIAGNOSIS — E559 Vitamin D deficiency, unspecified: Secondary | ICD-10-CM | POA: Diagnosis not present

## 2020-03-31 DIAGNOSIS — E039 Hypothyroidism, unspecified: Secondary | ICD-10-CM | POA: Diagnosis not present

## 2020-04-04 DIAGNOSIS — F419 Anxiety disorder, unspecified: Secondary | ICD-10-CM | POA: Diagnosis not present

## 2020-04-04 DIAGNOSIS — F062 Psychotic disorder with delusions due to known physiological condition: Secondary | ICD-10-CM | POA: Diagnosis not present

## 2020-04-04 DIAGNOSIS — F341 Dysthymic disorder: Secondary | ICD-10-CM | POA: Diagnosis not present

## 2020-04-04 DIAGNOSIS — R451 Restlessness and agitation: Secondary | ICD-10-CM | POA: Diagnosis not present

## 2020-04-05 DIAGNOSIS — Z20828 Contact with and (suspected) exposure to other viral communicable diseases: Secondary | ICD-10-CM | POA: Diagnosis not present

## 2020-04-05 DIAGNOSIS — Z20822 Contact with and (suspected) exposure to covid-19: Secondary | ICD-10-CM | POA: Diagnosis not present

## 2020-04-05 DIAGNOSIS — R54 Age-related physical debility: Secondary | ICD-10-CM | POA: Diagnosis not present

## 2020-04-07 DIAGNOSIS — F341 Dysthymic disorder: Secondary | ICD-10-CM | POA: Diagnosis not present

## 2020-04-07 DIAGNOSIS — F419 Anxiety disorder, unspecified: Secondary | ICD-10-CM | POA: Diagnosis not present

## 2020-04-10 DIAGNOSIS — G4700 Insomnia, unspecified: Secondary | ICD-10-CM | POA: Diagnosis not present

## 2020-04-10 DIAGNOSIS — F0390 Unspecified dementia without behavioral disturbance: Secondary | ICD-10-CM | POA: Diagnosis not present

## 2020-04-12 DIAGNOSIS — I10 Essential (primary) hypertension: Secondary | ICD-10-CM | POA: Diagnosis not present

## 2020-04-12 DIAGNOSIS — F0390 Unspecified dementia without behavioral disturbance: Secondary | ICD-10-CM | POA: Diagnosis not present

## 2020-04-24 DIAGNOSIS — J441 Chronic obstructive pulmonary disease with (acute) exacerbation: Secondary | ICD-10-CM | POA: Diagnosis not present

## 2020-04-24 DIAGNOSIS — J90 Pleural effusion, not elsewhere classified: Secondary | ICD-10-CM | POA: Diagnosis not present

## 2020-04-24 DIAGNOSIS — R0902 Hypoxemia: Secondary | ICD-10-CM | POA: Diagnosis not present

## 2020-04-24 DIAGNOSIS — I10 Essential (primary) hypertension: Secondary | ICD-10-CM | POA: Diagnosis not present

## 2020-04-24 DIAGNOSIS — R0602 Shortness of breath: Secondary | ICD-10-CM | POA: Diagnosis not present

## 2020-04-24 DIAGNOSIS — R069 Unspecified abnormalities of breathing: Secondary | ICD-10-CM | POA: Diagnosis not present

## 2020-04-24 DIAGNOSIS — Z87891 Personal history of nicotine dependence: Secondary | ICD-10-CM | POA: Diagnosis not present

## 2020-04-24 DIAGNOSIS — I499 Cardiac arrhythmia, unspecified: Secondary | ICD-10-CM | POA: Diagnosis not present

## 2020-04-26 DIAGNOSIS — F341 Dysthymic disorder: Secondary | ICD-10-CM | POA: Diagnosis not present

## 2020-04-26 DIAGNOSIS — J449 Chronic obstructive pulmonary disease, unspecified: Secondary | ICD-10-CM | POA: Diagnosis not present

## 2020-04-26 DIAGNOSIS — M6281 Muscle weakness (generalized): Secondary | ICD-10-CM | POA: Diagnosis not present

## 2020-04-26 DIAGNOSIS — Z9981 Dependence on supplemental oxygen: Secondary | ICD-10-CM | POA: Diagnosis not present

## 2020-04-26 DIAGNOSIS — R451 Restlessness and agitation: Secondary | ICD-10-CM | POA: Diagnosis not present

## 2020-04-26 DIAGNOSIS — F0390 Unspecified dementia without behavioral disturbance: Secondary | ICD-10-CM | POA: Diagnosis not present

## 2020-04-26 DIAGNOSIS — R0609 Other forms of dyspnea: Secondary | ICD-10-CM | POA: Diagnosis not present

## 2020-04-26 DIAGNOSIS — F419 Anxiety disorder, unspecified: Secondary | ICD-10-CM | POA: Diagnosis not present

## 2020-04-26 DIAGNOSIS — F062 Psychotic disorder with delusions due to known physiological condition: Secondary | ICD-10-CM | POA: Diagnosis not present

## 2020-04-27 DIAGNOSIS — R5381 Other malaise: Secondary | ICD-10-CM | POA: Diagnosis not present

## 2020-04-27 DIAGNOSIS — J449 Chronic obstructive pulmonary disease, unspecified: Secondary | ICD-10-CM | POA: Diagnosis not present

## 2020-04-27 DIAGNOSIS — F0390 Unspecified dementia without behavioral disturbance: Secondary | ICD-10-CM | POA: Diagnosis not present

## 2020-05-01 DIAGNOSIS — R600 Localized edema: Secondary | ICD-10-CM | POA: Diagnosis not present

## 2020-05-01 DIAGNOSIS — F0390 Unspecified dementia without behavioral disturbance: Secondary | ICD-10-CM | POA: Diagnosis not present

## 2020-05-02 DIAGNOSIS — F341 Dysthymic disorder: Secondary | ICD-10-CM | POA: Diagnosis not present

## 2020-05-02 DIAGNOSIS — L609 Nail disorder, unspecified: Secondary | ICD-10-CM | POA: Diagnosis not present

## 2020-05-02 DIAGNOSIS — F419 Anxiety disorder, unspecified: Secondary | ICD-10-CM | POA: Diagnosis not present

## 2020-05-08 DIAGNOSIS — R600 Localized edema: Secondary | ICD-10-CM | POA: Diagnosis not present

## 2020-05-08 DIAGNOSIS — F0390 Unspecified dementia without behavioral disturbance: Secondary | ICD-10-CM | POA: Diagnosis not present

## 2020-05-08 DIAGNOSIS — I10 Essential (primary) hypertension: Secondary | ICD-10-CM | POA: Diagnosis not present

## 2020-05-10 DIAGNOSIS — J4540 Moderate persistent asthma, uncomplicated: Secondary | ICD-10-CM | POA: Diagnosis not present

## 2020-05-10 DIAGNOSIS — F062 Psychotic disorder with delusions due to known physiological condition: Secondary | ICD-10-CM | POA: Diagnosis not present

## 2020-05-10 DIAGNOSIS — R451 Restlessness and agitation: Secondary | ICD-10-CM | POA: Diagnosis not present

## 2020-05-10 DIAGNOSIS — F419 Anxiety disorder, unspecified: Secondary | ICD-10-CM | POA: Diagnosis not present

## 2020-05-10 DIAGNOSIS — F341 Dysthymic disorder: Secondary | ICD-10-CM | POA: Diagnosis not present

## 2020-05-11 DIAGNOSIS — F0390 Unspecified dementia without behavioral disturbance: Secondary | ICD-10-CM | POA: Diagnosis not present

## 2020-05-11 DIAGNOSIS — R600 Localized edema: Secondary | ICD-10-CM | POA: Diagnosis not present

## 2020-05-18 DIAGNOSIS — Z20828 Contact with and (suspected) exposure to other viral communicable diseases: Secondary | ICD-10-CM | POA: Diagnosis not present

## 2020-05-19 DIAGNOSIS — F341 Dysthymic disorder: Secondary | ICD-10-CM | POA: Diagnosis not present

## 2020-05-19 DIAGNOSIS — F419 Anxiety disorder, unspecified: Secondary | ICD-10-CM | POA: Diagnosis not present

## 2020-05-24 DIAGNOSIS — F062 Psychotic disorder with delusions due to known physiological condition: Secondary | ICD-10-CM | POA: Diagnosis not present

## 2020-05-24 DIAGNOSIS — J4540 Moderate persistent asthma, uncomplicated: Secondary | ICD-10-CM | POA: Diagnosis not present

## 2020-05-24 DIAGNOSIS — F419 Anxiety disorder, unspecified: Secondary | ICD-10-CM | POA: Diagnosis not present

## 2020-05-24 DIAGNOSIS — R451 Restlessness and agitation: Secondary | ICD-10-CM | POA: Diagnosis not present

## 2020-05-24 DIAGNOSIS — J9611 Chronic respiratory failure with hypoxia: Secondary | ICD-10-CM | POA: Diagnosis not present

## 2020-05-24 DIAGNOSIS — F341 Dysthymic disorder: Secondary | ICD-10-CM | POA: Diagnosis not present

## 2020-05-25 DIAGNOSIS — Z20828 Contact with and (suspected) exposure to other viral communicable diseases: Secondary | ICD-10-CM | POA: Diagnosis not present

## 2020-05-26 DIAGNOSIS — R0989 Other specified symptoms and signs involving the circulatory and respiratory systems: Secondary | ICD-10-CM | POA: Diagnosis not present

## 2020-05-28 DIAGNOSIS — N390 Urinary tract infection, site not specified: Secondary | ICD-10-CM | POA: Diagnosis not present

## 2020-05-31 DIAGNOSIS — I350 Nonrheumatic aortic (valve) stenosis: Secondary | ICD-10-CM | POA: Diagnosis not present

## 2020-05-31 DIAGNOSIS — I5030 Unspecified diastolic (congestive) heart failure: Secondary | ICD-10-CM | POA: Diagnosis not present

## 2020-06-01 DIAGNOSIS — Z20828 Contact with and (suspected) exposure to other viral communicable diseases: Secondary | ICD-10-CM | POA: Diagnosis not present

## 2020-06-02 DIAGNOSIS — F419 Anxiety disorder, unspecified: Secondary | ICD-10-CM | POA: Diagnosis not present

## 2020-06-02 DIAGNOSIS — F341 Dysthymic disorder: Secondary | ICD-10-CM | POA: Diagnosis not present

## 2020-06-07 DIAGNOSIS — Z20828 Contact with and (suspected) exposure to other viral communicable diseases: Secondary | ICD-10-CM | POA: Diagnosis not present

## 2020-06-08 DIAGNOSIS — R0600 Dyspnea, unspecified: Secondary | ICD-10-CM | POA: Diagnosis not present

## 2020-06-12 DIAGNOSIS — J9611 Chronic respiratory failure with hypoxia: Secondary | ICD-10-CM | POA: Diagnosis not present

## 2020-06-12 DIAGNOSIS — J4540 Moderate persistent asthma, uncomplicated: Secondary | ICD-10-CM | POA: Diagnosis not present

## 2020-06-14 DIAGNOSIS — Z20828 Contact with and (suspected) exposure to other viral communicable diseases: Secondary | ICD-10-CM | POA: Diagnosis not present

## 2020-06-19 DIAGNOSIS — F419 Anxiety disorder, unspecified: Secondary | ICD-10-CM | POA: Diagnosis not present

## 2020-06-19 DIAGNOSIS — F341 Dysthymic disorder: Secondary | ICD-10-CM | POA: Diagnosis not present

## 2020-06-21 DIAGNOSIS — F062 Psychotic disorder with delusions due to known physiological condition: Secondary | ICD-10-CM | POA: Diagnosis not present

## 2020-06-21 DIAGNOSIS — F341 Dysthymic disorder: Secondary | ICD-10-CM | POA: Diagnosis not present

## 2020-06-21 DIAGNOSIS — R451 Restlessness and agitation: Secondary | ICD-10-CM | POA: Diagnosis not present

## 2020-06-21 DIAGNOSIS — F419 Anxiety disorder, unspecified: Secondary | ICD-10-CM | POA: Diagnosis not present

## 2020-06-21 DIAGNOSIS — Z20828 Contact with and (suspected) exposure to other viral communicable diseases: Secondary | ICD-10-CM | POA: Diagnosis not present

## 2020-06-28 DIAGNOSIS — Z20828 Contact with and (suspected) exposure to other viral communicable diseases: Secondary | ICD-10-CM | POA: Diagnosis not present

## 2020-06-28 DIAGNOSIS — I2510 Atherosclerotic heart disease of native coronary artery without angina pectoris: Secondary | ICD-10-CM | POA: Diagnosis not present

## 2020-06-28 DIAGNOSIS — J84112 Idiopathic pulmonary fibrosis: Secondary | ICD-10-CM | POA: Diagnosis not present

## 2020-06-28 DIAGNOSIS — J439 Emphysema, unspecified: Secondary | ICD-10-CM | POA: Diagnosis not present

## 2020-06-28 DIAGNOSIS — J432 Centrilobular emphysema: Secondary | ICD-10-CM | POA: Diagnosis not present

## 2020-06-28 DIAGNOSIS — I700 Atherosclerosis of aorta: Secondary | ICD-10-CM | POA: Diagnosis not present

## 2020-07-04 DIAGNOSIS — F341 Dysthymic disorder: Secondary | ICD-10-CM | POA: Diagnosis not present

## 2020-07-04 DIAGNOSIS — F419 Anxiety disorder, unspecified: Secondary | ICD-10-CM | POA: Diagnosis not present

## 2020-07-05 DIAGNOSIS — Z20828 Contact with and (suspected) exposure to other viral communicable diseases: Secondary | ICD-10-CM | POA: Diagnosis not present

## 2020-07-05 DIAGNOSIS — Z23 Encounter for immunization: Secondary | ICD-10-CM | POA: Diagnosis not present

## 2020-07-12 DIAGNOSIS — R634 Abnormal weight loss: Secondary | ICD-10-CM | POA: Diagnosis not present

## 2020-07-12 DIAGNOSIS — R600 Localized edema: Secondary | ICD-10-CM | POA: Diagnosis not present

## 2020-07-12 DIAGNOSIS — Z20828 Contact with and (suspected) exposure to other viral communicable diseases: Secondary | ICD-10-CM | POA: Diagnosis not present

## 2020-07-12 DIAGNOSIS — F0390 Unspecified dementia without behavioral disturbance: Secondary | ICD-10-CM | POA: Diagnosis not present

## 2020-07-13 DIAGNOSIS — I10 Essential (primary) hypertension: Secondary | ICD-10-CM | POA: Diagnosis not present

## 2020-07-13 DIAGNOSIS — D649 Anemia, unspecified: Secondary | ICD-10-CM | POA: Diagnosis not present

## 2020-07-13 DIAGNOSIS — I959 Hypotension, unspecified: Secondary | ICD-10-CM | POA: Diagnosis not present

## 2020-07-18 DIAGNOSIS — F419 Anxiety disorder, unspecified: Secondary | ICD-10-CM | POA: Diagnosis not present

## 2020-07-18 DIAGNOSIS — F341 Dysthymic disorder: Secondary | ICD-10-CM | POA: Diagnosis not present

## 2020-07-19 DIAGNOSIS — Z20828 Contact with and (suspected) exposure to other viral communicable diseases: Secondary | ICD-10-CM | POA: Diagnosis not present

## 2020-07-26 DIAGNOSIS — F341 Dysthymic disorder: Secondary | ICD-10-CM | POA: Diagnosis not present

## 2020-07-26 DIAGNOSIS — R451 Restlessness and agitation: Secondary | ICD-10-CM | POA: Diagnosis not present

## 2020-07-26 DIAGNOSIS — F062 Psychotic disorder with delusions due to known physiological condition: Secondary | ICD-10-CM | POA: Diagnosis not present

## 2020-07-26 DIAGNOSIS — F419 Anxiety disorder, unspecified: Secondary | ICD-10-CM | POA: Diagnosis not present

## 2020-08-01 DIAGNOSIS — L609 Nail disorder, unspecified: Secondary | ICD-10-CM | POA: Diagnosis not present

## 2020-08-02 DIAGNOSIS — F341 Dysthymic disorder: Secondary | ICD-10-CM | POA: Diagnosis not present

## 2020-08-02 DIAGNOSIS — F419 Anxiety disorder, unspecified: Secondary | ICD-10-CM | POA: Diagnosis not present

## 2020-08-03 DIAGNOSIS — R739 Hyperglycemia, unspecified: Secondary | ICD-10-CM | POA: Diagnosis not present

## 2020-08-03 DIAGNOSIS — F0390 Unspecified dementia without behavioral disturbance: Secondary | ICD-10-CM | POA: Diagnosis not present

## 2020-08-03 DIAGNOSIS — E1165 Type 2 diabetes mellitus with hyperglycemia: Secondary | ICD-10-CM | POA: Diagnosis not present

## 2020-08-03 DIAGNOSIS — Z9181 History of falling: Secondary | ICD-10-CM | POA: Diagnosis not present

## 2020-08-09 DIAGNOSIS — J9611 Chronic respiratory failure with hypoxia: Secondary | ICD-10-CM | POA: Diagnosis not present

## 2020-08-09 DIAGNOSIS — J4540 Moderate persistent asthma, uncomplicated: Secondary | ICD-10-CM | POA: Diagnosis not present

## 2020-08-09 DIAGNOSIS — J8410 Pulmonary fibrosis, unspecified: Secondary | ICD-10-CM | POA: Diagnosis not present

## 2020-08-18 DIAGNOSIS — F341 Dysthymic disorder: Secondary | ICD-10-CM | POA: Diagnosis not present

## 2020-08-18 DIAGNOSIS — F419 Anxiety disorder, unspecified: Secondary | ICD-10-CM | POA: Diagnosis not present

## 2020-08-23 DIAGNOSIS — E1122 Type 2 diabetes mellitus with diabetic chronic kidney disease: Secondary | ICD-10-CM | POA: Diagnosis not present

## 2020-08-23 DIAGNOSIS — F062 Psychotic disorder with delusions due to known physiological condition: Secondary | ICD-10-CM | POA: Diagnosis not present

## 2020-08-23 DIAGNOSIS — D649 Anemia, unspecified: Secondary | ICD-10-CM | POA: Diagnosis not present

## 2020-08-23 DIAGNOSIS — F0390 Unspecified dementia without behavioral disturbance: Secondary | ICD-10-CM | POA: Diagnosis not present

## 2020-08-23 DIAGNOSIS — I129 Hypertensive chronic kidney disease with stage 1 through stage 4 chronic kidney disease, or unspecified chronic kidney disease: Secondary | ICD-10-CM | POA: Diagnosis not present

## 2020-08-23 DIAGNOSIS — F419 Anxiety disorder, unspecified: Secondary | ICD-10-CM | POA: Diagnosis not present

## 2020-08-23 DIAGNOSIS — R451 Restlessness and agitation: Secondary | ICD-10-CM | POA: Diagnosis not present

## 2020-08-23 DIAGNOSIS — N184 Chronic kidney disease, stage 4 (severe): Secondary | ICD-10-CM | POA: Diagnosis not present

## 2020-08-23 DIAGNOSIS — F341 Dysthymic disorder: Secondary | ICD-10-CM | POA: Diagnosis not present

## 2020-08-24 DIAGNOSIS — E039 Hypothyroidism, unspecified: Secondary | ICD-10-CM | POA: Diagnosis not present

## 2020-08-24 DIAGNOSIS — Z1329 Encounter for screening for other suspected endocrine disorder: Secondary | ICD-10-CM | POA: Diagnosis not present

## 2020-08-24 DIAGNOSIS — E119 Type 2 diabetes mellitus without complications: Secondary | ICD-10-CM | POA: Diagnosis not present

## 2020-08-30 DIAGNOSIS — F341 Dysthymic disorder: Secondary | ICD-10-CM | POA: Diagnosis not present

## 2020-08-30 DIAGNOSIS — F419 Anxiety disorder, unspecified: Secondary | ICD-10-CM | POA: Diagnosis not present

## 2020-09-06 DIAGNOSIS — K5900 Constipation, unspecified: Secondary | ICD-10-CM | POA: Diagnosis not present

## 2020-09-06 DIAGNOSIS — F0390 Unspecified dementia without behavioral disturbance: Secondary | ICD-10-CM | POA: Diagnosis not present

## 2020-09-22 DIAGNOSIS — F062 Psychotic disorder with delusions due to known physiological condition: Secondary | ICD-10-CM | POA: Diagnosis not present

## 2020-09-22 DIAGNOSIS — F419 Anxiety disorder, unspecified: Secondary | ICD-10-CM | POA: Diagnosis not present

## 2020-09-22 DIAGNOSIS — R451 Restlessness and agitation: Secondary | ICD-10-CM | POA: Diagnosis not present

## 2020-09-22 DIAGNOSIS — F341 Dysthymic disorder: Secondary | ICD-10-CM | POA: Diagnosis not present

## 2020-09-26 DIAGNOSIS — Z20828 Contact with and (suspected) exposure to other viral communicable diseases: Secondary | ICD-10-CM | POA: Diagnosis not present

## 2020-09-27 DIAGNOSIS — F419 Anxiety disorder, unspecified: Secondary | ICD-10-CM | POA: Diagnosis not present

## 2020-09-27 DIAGNOSIS — F341 Dysthymic disorder: Secondary | ICD-10-CM | POA: Diagnosis not present

## 2020-10-04 DIAGNOSIS — Z20828 Contact with and (suspected) exposure to other viral communicable diseases: Secondary | ICD-10-CM | POA: Diagnosis not present

## 2020-10-06 DIAGNOSIS — E119 Type 2 diabetes mellitus without complications: Secondary | ICD-10-CM | POA: Diagnosis not present

## 2020-10-06 DIAGNOSIS — F0390 Unspecified dementia without behavioral disturbance: Secondary | ICD-10-CM | POA: Diagnosis not present

## 2020-10-06 DIAGNOSIS — Z9111 Patient's noncompliance with dietary regimen: Secondary | ICD-10-CM | POA: Diagnosis not present

## 2020-10-25 DIAGNOSIS — F062 Psychotic disorder with delusions due to known physiological condition: Secondary | ICD-10-CM | POA: Diagnosis not present

## 2020-10-25 DIAGNOSIS — F341 Dysthymic disorder: Secondary | ICD-10-CM | POA: Diagnosis not present

## 2020-10-25 DIAGNOSIS — F419 Anxiety disorder, unspecified: Secondary | ICD-10-CM | POA: Diagnosis not present

## 2020-10-25 DIAGNOSIS — R451 Restlessness and agitation: Secondary | ICD-10-CM | POA: Diagnosis not present

## 2020-10-30 DIAGNOSIS — F419 Anxiety disorder, unspecified: Secondary | ICD-10-CM | POA: Diagnosis not present

## 2020-10-30 DIAGNOSIS — Z20828 Contact with and (suspected) exposure to other viral communicable diseases: Secondary | ICD-10-CM | POA: Diagnosis not present

## 2020-10-30 DIAGNOSIS — F341 Dysthymic disorder: Secondary | ICD-10-CM | POA: Diagnosis not present

## 2020-10-31 DIAGNOSIS — L609 Nail disorder, unspecified: Secondary | ICD-10-CM | POA: Diagnosis not present

## 2020-11-01 DIAGNOSIS — N390 Urinary tract infection, site not specified: Secondary | ICD-10-CM | POA: Diagnosis not present

## 2020-11-02 DIAGNOSIS — F341 Dysthymic disorder: Secondary | ICD-10-CM | POA: Diagnosis not present

## 2020-11-02 DIAGNOSIS — F062 Psychotic disorder with delusions due to known physiological condition: Secondary | ICD-10-CM | POA: Diagnosis not present

## 2020-11-02 DIAGNOSIS — F419 Anxiety disorder, unspecified: Secondary | ICD-10-CM | POA: Diagnosis not present

## 2020-11-02 DIAGNOSIS — R451 Restlessness and agitation: Secondary | ICD-10-CM | POA: Diagnosis not present

## 2020-11-07 DIAGNOSIS — J4540 Moderate persistent asthma, uncomplicated: Secondary | ICD-10-CM | POA: Diagnosis not present

## 2020-11-07 DIAGNOSIS — Z20828 Contact with and (suspected) exposure to other viral communicable diseases: Secondary | ICD-10-CM | POA: Diagnosis not present

## 2020-11-07 DIAGNOSIS — J8410 Pulmonary fibrosis, unspecified: Secondary | ICD-10-CM | POA: Diagnosis not present

## 2020-11-07 DIAGNOSIS — J9611 Chronic respiratory failure with hypoxia: Secondary | ICD-10-CM | POA: Diagnosis not present

## 2020-11-14 DIAGNOSIS — Z20828 Contact with and (suspected) exposure to other viral communicable diseases: Secondary | ICD-10-CM | POA: Diagnosis not present

## 2020-11-21 DIAGNOSIS — Z20828 Contact with and (suspected) exposure to other viral communicable diseases: Secondary | ICD-10-CM | POA: Diagnosis not present

## 2020-11-21 DIAGNOSIS — F419 Anxiety disorder, unspecified: Secondary | ICD-10-CM | POA: Diagnosis not present

## 2020-11-21 DIAGNOSIS — F341 Dysthymic disorder: Secondary | ICD-10-CM | POA: Diagnosis not present

## 2020-11-22 DIAGNOSIS — F341 Dysthymic disorder: Secondary | ICD-10-CM | POA: Diagnosis not present

## 2020-11-22 DIAGNOSIS — F062 Psychotic disorder with delusions due to known physiological condition: Secondary | ICD-10-CM | POA: Diagnosis not present

## 2020-11-22 DIAGNOSIS — F419 Anxiety disorder, unspecified: Secondary | ICD-10-CM | POA: Diagnosis not present

## 2020-11-22 DIAGNOSIS — R451 Restlessness and agitation: Secondary | ICD-10-CM | POA: Diagnosis not present

## 2020-11-27 DIAGNOSIS — Z9981 Dependence on supplemental oxygen: Secondary | ICD-10-CM | POA: Diagnosis not present

## 2020-11-27 DIAGNOSIS — J45909 Unspecified asthma, uncomplicated: Secondary | ICD-10-CM | POA: Diagnosis not present

## 2020-11-27 DIAGNOSIS — J3489 Other specified disorders of nose and nasal sinuses: Secondary | ICD-10-CM | POA: Diagnosis not present

## 2020-11-27 DIAGNOSIS — F0390 Unspecified dementia without behavioral disturbance: Secondary | ICD-10-CM | POA: Diagnosis not present

## 2020-11-27 DIAGNOSIS — J8410 Pulmonary fibrosis, unspecified: Secondary | ICD-10-CM | POA: Diagnosis not present

## 2020-11-29 DIAGNOSIS — Z20828 Contact with and (suspected) exposure to other viral communicable diseases: Secondary | ICD-10-CM | POA: Diagnosis not present

## 2020-12-05 DIAGNOSIS — Z87891 Personal history of nicotine dependence: Secondary | ICD-10-CM | POA: Diagnosis not present

## 2020-12-05 DIAGNOSIS — Z794 Long term (current) use of insulin: Secondary | ICD-10-CM | POA: Diagnosis not present

## 2020-12-05 DIAGNOSIS — E785 Hyperlipidemia, unspecified: Secondary | ICD-10-CM | POA: Diagnosis not present

## 2020-12-05 DIAGNOSIS — Z711 Person with feared health complaint in whom no diagnosis is made: Secondary | ICD-10-CM | POA: Diagnosis not present

## 2020-12-05 DIAGNOSIS — M1990 Unspecified osteoarthritis, unspecified site: Secondary | ICD-10-CM | POA: Diagnosis not present

## 2020-12-05 DIAGNOSIS — Z7952 Long term (current) use of systemic steroids: Secondary | ICD-10-CM | POA: Diagnosis not present

## 2020-12-05 DIAGNOSIS — E119 Type 2 diabetes mellitus without complications: Secondary | ICD-10-CM | POA: Diagnosis not present

## 2020-12-05 DIAGNOSIS — G479 Sleep disorder, unspecified: Secondary | ICD-10-CM | POA: Diagnosis not present

## 2020-12-05 DIAGNOSIS — Z79899 Other long term (current) drug therapy: Secondary | ICD-10-CM | POA: Diagnosis not present

## 2020-12-05 DIAGNOSIS — K117 Disturbances of salivary secretion: Secondary | ICD-10-CM | POA: Diagnosis not present

## 2020-12-06 DIAGNOSIS — Z20828 Contact with and (suspected) exposure to other viral communicable diseases: Secondary | ICD-10-CM | POA: Diagnosis not present

## 2020-12-08 DIAGNOSIS — R269 Unspecified abnormalities of gait and mobility: Secondary | ICD-10-CM | POA: Diagnosis not present

## 2020-12-08 DIAGNOSIS — W19XXXA Unspecified fall, initial encounter: Secondary | ICD-10-CM | POA: Diagnosis not present

## 2020-12-08 DIAGNOSIS — N184 Chronic kidney disease, stage 4 (severe): Secondary | ICD-10-CM | POA: Diagnosis not present

## 2020-12-08 DIAGNOSIS — E162 Hypoglycemia, unspecified: Secondary | ICD-10-CM | POA: Diagnosis not present

## 2020-12-08 DIAGNOSIS — E1122 Type 2 diabetes mellitus with diabetic chronic kidney disease: Secondary | ICD-10-CM | POA: Diagnosis not present

## 2020-12-08 DIAGNOSIS — F0390 Unspecified dementia without behavioral disturbance: Secondary | ICD-10-CM | POA: Diagnosis not present

## 2020-12-08 DIAGNOSIS — M6281 Muscle weakness (generalized): Secondary | ICD-10-CM | POA: Diagnosis not present

## 2020-12-08 DIAGNOSIS — R2689 Other abnormalities of gait and mobility: Secondary | ICD-10-CM | POA: Diagnosis not present

## 2020-12-11 DIAGNOSIS — F341 Dysthymic disorder: Secondary | ICD-10-CM | POA: Diagnosis not present

## 2020-12-11 DIAGNOSIS — F419 Anxiety disorder, unspecified: Secondary | ICD-10-CM | POA: Diagnosis not present

## 2020-12-13 DIAGNOSIS — E162 Hypoglycemia, unspecified: Secondary | ICD-10-CM | POA: Diagnosis not present

## 2020-12-13 DIAGNOSIS — E119 Type 2 diabetes mellitus without complications: Secondary | ICD-10-CM | POA: Diagnosis not present

## 2020-12-13 DIAGNOSIS — F0390 Unspecified dementia without behavioral disturbance: Secondary | ICD-10-CM | POA: Diagnosis not present

## 2020-12-13 DIAGNOSIS — R635 Abnormal weight gain: Secondary | ICD-10-CM | POA: Diagnosis not present

## 2020-12-15 DIAGNOSIS — E162 Hypoglycemia, unspecified: Secondary | ICD-10-CM | POA: Diagnosis not present

## 2020-12-15 DIAGNOSIS — F0390 Unspecified dementia without behavioral disturbance: Secondary | ICD-10-CM | POA: Diagnosis not present

## 2020-12-15 DIAGNOSIS — E119 Type 2 diabetes mellitus without complications: Secondary | ICD-10-CM | POA: Diagnosis not present

## 2020-12-20 DIAGNOSIS — F419 Anxiety disorder, unspecified: Secondary | ICD-10-CM | POA: Diagnosis not present

## 2020-12-20 DIAGNOSIS — F062 Psychotic disorder with delusions due to known physiological condition: Secondary | ICD-10-CM | POA: Diagnosis not present

## 2020-12-20 DIAGNOSIS — R451 Restlessness and agitation: Secondary | ICD-10-CM | POA: Diagnosis not present

## 2020-12-20 DIAGNOSIS — F341 Dysthymic disorder: Secondary | ICD-10-CM | POA: Diagnosis not present

## 2020-12-25 DIAGNOSIS — R1030 Lower abdominal pain, unspecified: Secondary | ICD-10-CM | POA: Diagnosis not present

## 2020-12-25 DIAGNOSIS — E1165 Type 2 diabetes mellitus with hyperglycemia: Secondary | ICD-10-CM | POA: Diagnosis not present

## 2020-12-25 DIAGNOSIS — R5381 Other malaise: Secondary | ICD-10-CM | POA: Diagnosis not present

## 2020-12-25 DIAGNOSIS — N390 Urinary tract infection, site not specified: Secondary | ICD-10-CM | POA: Diagnosis not present

## 2020-12-25 DIAGNOSIS — F0390 Unspecified dementia without behavioral disturbance: Secondary | ICD-10-CM | POA: Diagnosis not present

## 2020-12-25 DIAGNOSIS — G4710 Hypersomnia, unspecified: Secondary | ICD-10-CM | POA: Diagnosis not present

## 2020-12-29 DIAGNOSIS — R21 Rash and other nonspecific skin eruption: Secondary | ICD-10-CM | POA: Diagnosis not present

## 2020-12-29 DIAGNOSIS — L299 Pruritus, unspecified: Secondary | ICD-10-CM | POA: Diagnosis not present

## 2020-12-29 DIAGNOSIS — F0390 Unspecified dementia without behavioral disturbance: Secondary | ICD-10-CM | POA: Diagnosis not present

## 2020-12-29 DIAGNOSIS — L853 Xerosis cutis: Secondary | ICD-10-CM | POA: Diagnosis not present

## 2021-01-01 DIAGNOSIS — F419 Anxiety disorder, unspecified: Secondary | ICD-10-CM | POA: Diagnosis not present

## 2021-01-01 DIAGNOSIS — F341 Dysthymic disorder: Secondary | ICD-10-CM | POA: Diagnosis not present

## 2021-01-05 DIAGNOSIS — E119 Type 2 diabetes mellitus without complications: Secondary | ICD-10-CM | POA: Diagnosis not present

## 2021-01-05 DIAGNOSIS — R54 Age-related physical debility: Secondary | ICD-10-CM | POA: Diagnosis not present

## 2021-01-05 DIAGNOSIS — E162 Hypoglycemia, unspecified: Secondary | ICD-10-CM | POA: Diagnosis not present

## 2021-01-05 DIAGNOSIS — F0390 Unspecified dementia without behavioral disturbance: Secondary | ICD-10-CM | POA: Diagnosis not present

## 2021-01-11 DIAGNOSIS — R5381 Other malaise: Secondary | ICD-10-CM | POA: Diagnosis not present

## 2021-01-11 DIAGNOSIS — I10 Essential (primary) hypertension: Secondary | ICD-10-CM | POA: Diagnosis not present

## 2021-01-11 DIAGNOSIS — F0390 Unspecified dementia without behavioral disturbance: Secondary | ICD-10-CM | POA: Diagnosis not present

## 2021-01-17 DIAGNOSIS — I129 Hypertensive chronic kidney disease with stage 1 through stage 4 chronic kidney disease, or unspecified chronic kidney disease: Secondary | ICD-10-CM | POA: Diagnosis not present

## 2021-01-17 DIAGNOSIS — F0390 Unspecified dementia without behavioral disturbance: Secondary | ICD-10-CM | POA: Diagnosis not present

## 2021-01-17 DIAGNOSIS — N183 Chronic kidney disease, stage 3 unspecified: Secondary | ICD-10-CM | POA: Diagnosis not present

## 2021-01-18 DIAGNOSIS — R000 Tachycardia, unspecified: Secondary | ICD-10-CM | POA: Diagnosis not present

## 2021-01-18 DIAGNOSIS — I10 Essential (primary) hypertension: Secondary | ICD-10-CM | POA: Diagnosis not present

## 2021-01-18 DIAGNOSIS — R54 Age-related physical debility: Secondary | ICD-10-CM | POA: Diagnosis not present

## 2021-01-18 DIAGNOSIS — F0390 Unspecified dementia without behavioral disturbance: Secondary | ICD-10-CM | POA: Diagnosis not present

## 2021-01-24 DIAGNOSIS — F419 Anxiety disorder, unspecified: Secondary | ICD-10-CM | POA: Diagnosis not present

## 2021-01-24 DIAGNOSIS — F341 Dysthymic disorder: Secondary | ICD-10-CM | POA: Diagnosis not present

## 2021-01-24 DIAGNOSIS — F062 Psychotic disorder with delusions due to known physiological condition: Secondary | ICD-10-CM | POA: Diagnosis not present

## 2021-01-24 DIAGNOSIS — R451 Restlessness and agitation: Secondary | ICD-10-CM | POA: Diagnosis not present

## 2021-01-29 DIAGNOSIS — J4540 Moderate persistent asthma, uncomplicated: Secondary | ICD-10-CM | POA: Diagnosis not present

## 2021-01-29 DIAGNOSIS — J8410 Pulmonary fibrosis, unspecified: Secondary | ICD-10-CM | POA: Diagnosis not present

## 2021-01-29 DIAGNOSIS — J9611 Chronic respiratory failure with hypoxia: Secondary | ICD-10-CM | POA: Diagnosis not present

## 2021-01-30 DIAGNOSIS — L609 Nail disorder, unspecified: Secondary | ICD-10-CM | POA: Diagnosis not present

## 2021-01-30 DIAGNOSIS — F419 Anxiety disorder, unspecified: Secondary | ICD-10-CM | POA: Diagnosis not present

## 2021-01-30 DIAGNOSIS — F341 Dysthymic disorder: Secondary | ICD-10-CM | POA: Diagnosis not present

## 2021-01-31 DIAGNOSIS — E1122 Type 2 diabetes mellitus with diabetic chronic kidney disease: Secondary | ICD-10-CM | POA: Diagnosis not present

## 2021-01-31 DIAGNOSIS — N184 Chronic kidney disease, stage 4 (severe): Secondary | ICD-10-CM | POA: Diagnosis not present

## 2021-01-31 DIAGNOSIS — F0390 Unspecified dementia without behavioral disturbance: Secondary | ICD-10-CM | POA: Diagnosis not present

## 2021-01-31 DIAGNOSIS — I129 Hypertensive chronic kidney disease with stage 1 through stage 4 chronic kidney disease, or unspecified chronic kidney disease: Secondary | ICD-10-CM | POA: Diagnosis not present

## 2021-01-31 DIAGNOSIS — J449 Chronic obstructive pulmonary disease, unspecified: Secondary | ICD-10-CM | POA: Diagnosis not present

## 2021-02-07 DIAGNOSIS — F0390 Unspecified dementia without behavioral disturbance: Secondary | ICD-10-CM | POA: Diagnosis not present

## 2021-02-07 DIAGNOSIS — R600 Localized edema: Secondary | ICD-10-CM | POA: Diagnosis not present

## 2021-02-07 DIAGNOSIS — R634 Abnormal weight loss: Secondary | ICD-10-CM | POA: Diagnosis not present

## 2021-02-13 DIAGNOSIS — Z20828 Contact with and (suspected) exposure to other viral communicable diseases: Secondary | ICD-10-CM | POA: Diagnosis not present

## 2021-02-19 DIAGNOSIS — F341 Dysthymic disorder: Secondary | ICD-10-CM | POA: Diagnosis not present

## 2021-02-19 DIAGNOSIS — Z20828 Contact with and (suspected) exposure to other viral communicable diseases: Secondary | ICD-10-CM | POA: Diagnosis not present

## 2021-02-19 DIAGNOSIS — F419 Anxiety disorder, unspecified: Secondary | ICD-10-CM | POA: Diagnosis not present

## 2021-02-21 DIAGNOSIS — F062 Psychotic disorder with delusions due to known physiological condition: Secondary | ICD-10-CM | POA: Diagnosis not present

## 2021-02-21 DIAGNOSIS — F341 Dysthymic disorder: Secondary | ICD-10-CM | POA: Diagnosis not present

## 2021-02-21 DIAGNOSIS — F419 Anxiety disorder, unspecified: Secondary | ICD-10-CM | POA: Diagnosis not present

## 2021-02-21 DIAGNOSIS — R451 Restlessness and agitation: Secondary | ICD-10-CM | POA: Diagnosis not present

## 2021-02-26 DIAGNOSIS — Z20828 Contact with and (suspected) exposure to other viral communicable diseases: Secondary | ICD-10-CM | POA: Diagnosis not present

## 2021-02-27 DIAGNOSIS — R600 Localized edema: Secondary | ICD-10-CM | POA: Diagnosis not present

## 2021-02-27 DIAGNOSIS — F0390 Unspecified dementia without behavioral disturbance: Secondary | ICD-10-CM | POA: Diagnosis not present

## 2021-02-27 DIAGNOSIS — R635 Abnormal weight gain: Secondary | ICD-10-CM | POA: Diagnosis not present

## 2021-02-27 DIAGNOSIS — R54 Age-related physical debility: Secondary | ICD-10-CM | POA: Diagnosis not present

## 2021-03-05 DIAGNOSIS — Z20828 Contact with and (suspected) exposure to other viral communicable diseases: Secondary | ICD-10-CM | POA: Diagnosis not present

## 2021-03-08 DIAGNOSIS — E1122 Type 2 diabetes mellitus with diabetic chronic kidney disease: Secondary | ICD-10-CM | POA: Diagnosis not present

## 2021-03-08 DIAGNOSIS — N184 Chronic kidney disease, stage 4 (severe): Secondary | ICD-10-CM | POA: Diagnosis not present

## 2021-03-08 DIAGNOSIS — F0390 Unspecified dementia without behavioral disturbance: Secondary | ICD-10-CM | POA: Diagnosis not present

## 2021-03-12 DIAGNOSIS — Z20828 Contact with and (suspected) exposure to other viral communicable diseases: Secondary | ICD-10-CM | POA: Diagnosis not present

## 2021-03-20 DIAGNOSIS — Z20828 Contact with and (suspected) exposure to other viral communicable diseases: Secondary | ICD-10-CM | POA: Diagnosis not present

## 2021-03-21 DIAGNOSIS — F419 Anxiety disorder, unspecified: Secondary | ICD-10-CM | POA: Diagnosis not present

## 2021-03-21 DIAGNOSIS — F062 Psychotic disorder with delusions due to known physiological condition: Secondary | ICD-10-CM | POA: Diagnosis not present

## 2021-03-21 DIAGNOSIS — F341 Dysthymic disorder: Secondary | ICD-10-CM | POA: Diagnosis not present

## 2021-03-21 DIAGNOSIS — R451 Restlessness and agitation: Secondary | ICD-10-CM | POA: Diagnosis not present

## 2021-03-27 DIAGNOSIS — Z20828 Contact with and (suspected) exposure to other viral communicable diseases: Secondary | ICD-10-CM | POA: Diagnosis not present

## 2021-03-30 DIAGNOSIS — F0390 Unspecified dementia without behavioral disturbance: Secondary | ICD-10-CM | POA: Diagnosis not present

## 2021-03-30 DIAGNOSIS — E119 Type 2 diabetes mellitus without complications: Secondary | ICD-10-CM | POA: Diagnosis not present

## 2021-03-30 DIAGNOSIS — E162 Hypoglycemia, unspecified: Secondary | ICD-10-CM | POA: Diagnosis not present

## 2021-03-30 DIAGNOSIS — R54 Age-related physical debility: Secondary | ICD-10-CM | POA: Diagnosis not present

## 2021-04-12 DIAGNOSIS — F341 Dysthymic disorder: Secondary | ICD-10-CM | POA: Diagnosis not present

## 2021-04-12 DIAGNOSIS — F419 Anxiety disorder, unspecified: Secondary | ICD-10-CM | POA: Diagnosis not present

## 2021-04-13 DIAGNOSIS — R197 Diarrhea, unspecified: Secondary | ICD-10-CM | POA: Diagnosis not present

## 2021-04-13 DIAGNOSIS — R5381 Other malaise: Secondary | ICD-10-CM | POA: Diagnosis not present

## 2021-04-13 DIAGNOSIS — R54 Age-related physical debility: Secondary | ICD-10-CM | POA: Diagnosis not present

## 2021-04-13 DIAGNOSIS — F0390 Unspecified dementia without behavioral disturbance: Secondary | ICD-10-CM | POA: Diagnosis not present

## 2021-04-16 DIAGNOSIS — R54 Age-related physical debility: Secondary | ICD-10-CM | POA: Diagnosis not present

## 2021-04-16 DIAGNOSIS — K6289 Other specified diseases of anus and rectum: Secondary | ICD-10-CM | POA: Diagnosis not present

## 2021-04-16 DIAGNOSIS — K5900 Constipation, unspecified: Secondary | ICD-10-CM | POA: Diagnosis not present

## 2021-04-16 DIAGNOSIS — K649 Unspecified hemorrhoids: Secondary | ICD-10-CM | POA: Diagnosis not present

## 2021-04-16 DIAGNOSIS — F0390 Unspecified dementia without behavioral disturbance: Secondary | ICD-10-CM | POA: Diagnosis not present

## 2021-04-25 DIAGNOSIS — F0390 Unspecified dementia without behavioral disturbance: Secondary | ICD-10-CM | POA: Diagnosis not present

## 2021-04-25 DIAGNOSIS — K649 Unspecified hemorrhoids: Secondary | ICD-10-CM | POA: Diagnosis not present

## 2021-04-25 DIAGNOSIS — F062 Psychotic disorder with delusions due to known physiological condition: Secondary | ICD-10-CM | POA: Diagnosis not present

## 2021-04-25 DIAGNOSIS — F419 Anxiety disorder, unspecified: Secondary | ICD-10-CM | POA: Diagnosis not present

## 2021-04-25 DIAGNOSIS — F341 Dysthymic disorder: Secondary | ICD-10-CM | POA: Diagnosis not present

## 2021-04-25 DIAGNOSIS — R54 Age-related physical debility: Secondary | ICD-10-CM | POA: Diagnosis not present

## 2021-05-02 DIAGNOSIS — S80811A Abrasion, right lower leg, initial encounter: Secondary | ICD-10-CM | POA: Diagnosis not present

## 2021-05-02 DIAGNOSIS — R54 Age-related physical debility: Secondary | ICD-10-CM | POA: Diagnosis not present

## 2021-05-02 DIAGNOSIS — F0390 Unspecified dementia without behavioral disturbance: Secondary | ICD-10-CM | POA: Diagnosis not present

## 2021-05-02 DIAGNOSIS — R600 Localized edema: Secondary | ICD-10-CM | POA: Diagnosis not present

## 2021-05-08 DIAGNOSIS — J4540 Moderate persistent asthma, uncomplicated: Secondary | ICD-10-CM | POA: Diagnosis not present

## 2021-05-08 DIAGNOSIS — F341 Dysthymic disorder: Secondary | ICD-10-CM | POA: Diagnosis not present

## 2021-05-08 DIAGNOSIS — F419 Anxiety disorder, unspecified: Secondary | ICD-10-CM | POA: Diagnosis not present

## 2021-05-08 DIAGNOSIS — L609 Nail disorder, unspecified: Secondary | ICD-10-CM | POA: Diagnosis not present

## 2021-05-08 DIAGNOSIS — J8410 Pulmonary fibrosis, unspecified: Secondary | ICD-10-CM | POA: Diagnosis not present

## 2021-05-08 DIAGNOSIS — J9611 Chronic respiratory failure with hypoxia: Secondary | ICD-10-CM | POA: Diagnosis not present

## 2021-05-21 DIAGNOSIS — Z20828 Contact with and (suspected) exposure to other viral communicable diseases: Secondary | ICD-10-CM | POA: Diagnosis not present

## 2021-05-23 DIAGNOSIS — R5381 Other malaise: Secondary | ICD-10-CM | POA: Diagnosis not present

## 2021-05-23 DIAGNOSIS — F0390 Unspecified dementia without behavioral disturbance: Secondary | ICD-10-CM | POA: Diagnosis not present

## 2021-05-23 DIAGNOSIS — R54 Age-related physical debility: Secondary | ICD-10-CM | POA: Diagnosis not present

## 2021-05-23 DIAGNOSIS — U071 COVID-19: Secondary | ICD-10-CM | POA: Diagnosis not present

## 2021-05-23 IMAGING — DX DG CHEST 1V PORT
1 series · 1 of 1 positions shown · non-contrast
Comparison: None.

CLINICAL DATA: Hypoxia.

EXAM:
PORTABLE CHEST 1 VIEW

[chest ap]
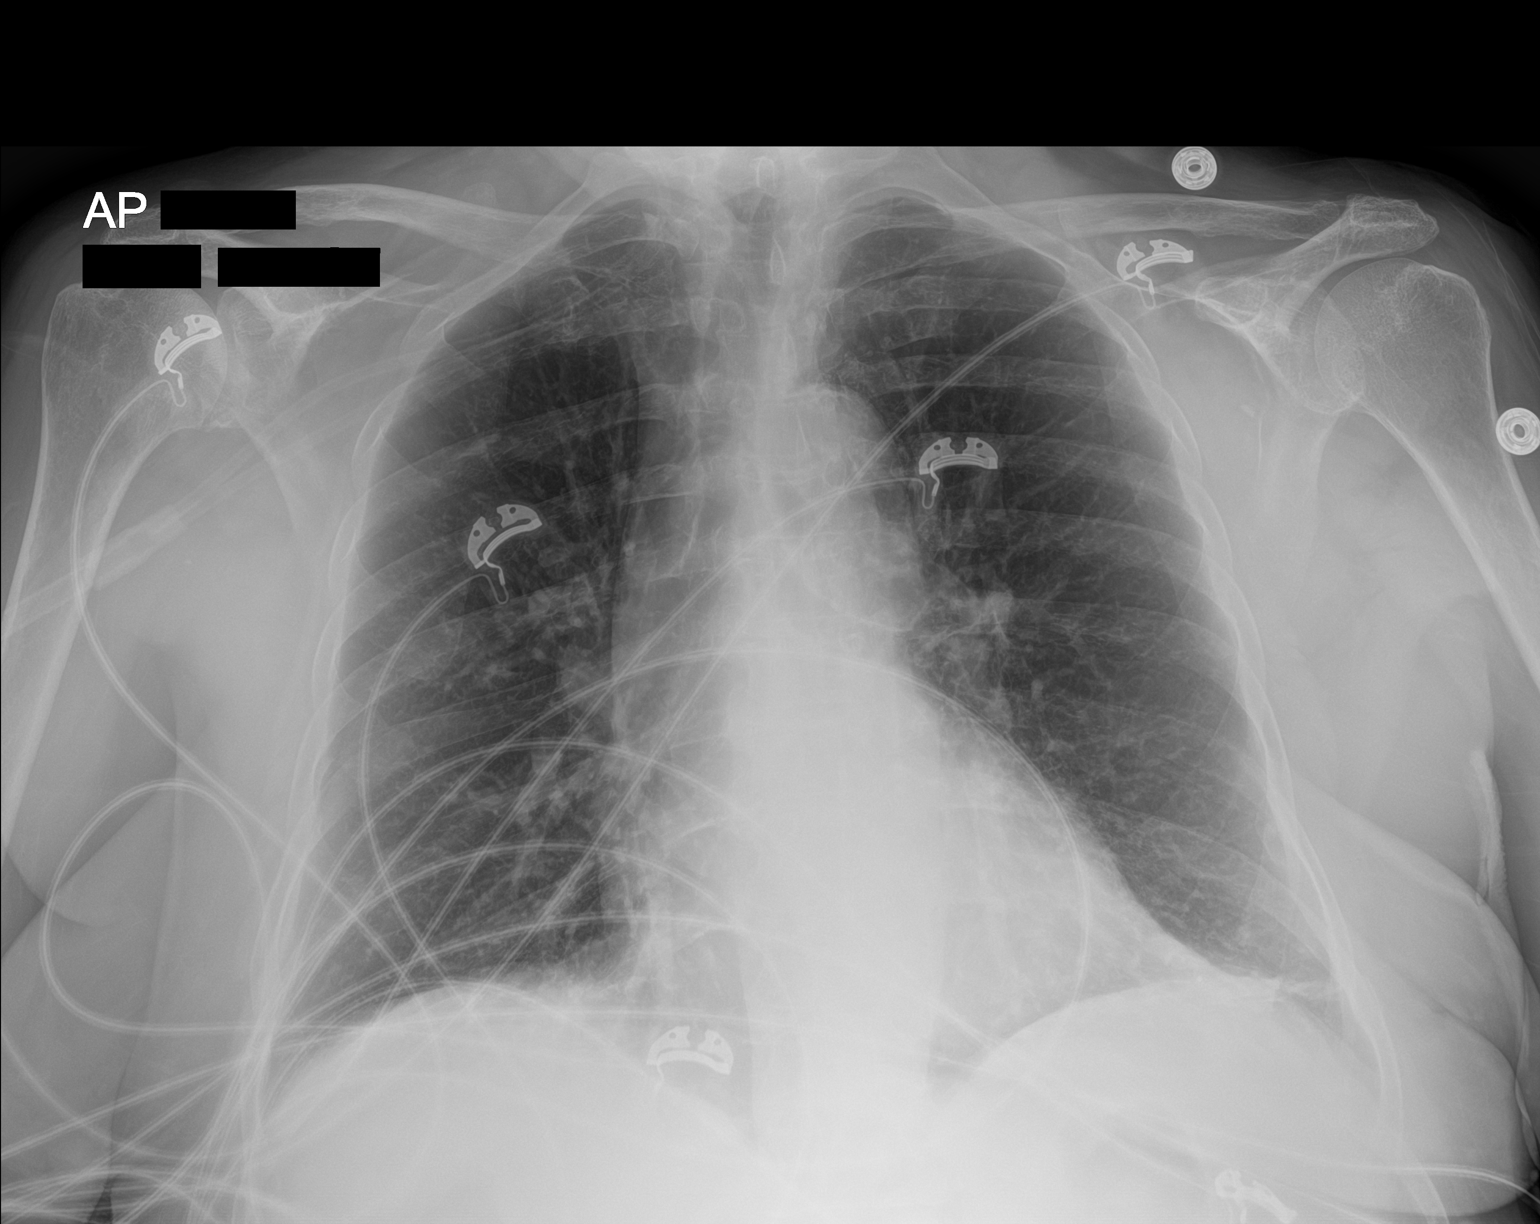

[1 of 1 positions shown; findings below may reference images not displayed]

FINDINGS: Very mild atelectasis is seen within the left lung base. There is no
evidence of acute infiltrate, pleural effusion or pneumothorax. The
heart size and mediastinal contours are within normal limits. The
visualized skeletal structures are unremarkable.
IMPRESSION: 1. Very mild left basilar atelectasis.

## 2021-05-30 DIAGNOSIS — F419 Anxiety disorder, unspecified: Secondary | ICD-10-CM | POA: Diagnosis not present

## 2021-05-30 DIAGNOSIS — F341 Dysthymic disorder: Secondary | ICD-10-CM | POA: Diagnosis not present

## 2021-06-06 DIAGNOSIS — J9611 Chronic respiratory failure with hypoxia: Secondary | ICD-10-CM | POA: Diagnosis not present

## 2021-06-06 DIAGNOSIS — Z87891 Personal history of nicotine dependence: Secondary | ICD-10-CM | POA: Diagnosis not present

## 2021-06-06 DIAGNOSIS — J4540 Moderate persistent asthma, uncomplicated: Secondary | ICD-10-CM | POA: Diagnosis not present

## 2021-06-06 DIAGNOSIS — J8410 Pulmonary fibrosis, unspecified: Secondary | ICD-10-CM | POA: Diagnosis not present

## 2021-06-06 DIAGNOSIS — G4733 Obstructive sleep apnea (adult) (pediatric): Secondary | ICD-10-CM | POA: Diagnosis not present

## 2021-06-07 DIAGNOSIS — F0390 Unspecified dementia without behavioral disturbance: Secondary | ICD-10-CM | POA: Diagnosis not present

## 2021-06-07 DIAGNOSIS — R54 Age-related physical debility: Secondary | ICD-10-CM | POA: Diagnosis not present

## 2021-06-07 DIAGNOSIS — Z8616 Personal history of COVID-19: Secondary | ICD-10-CM | POA: Diagnosis not present

## 2021-06-07 DIAGNOSIS — J3489 Other specified disorders of nose and nasal sinuses: Secondary | ICD-10-CM | POA: Diagnosis not present

## 2021-06-14 DIAGNOSIS — F0390 Unspecified dementia without behavioral disturbance: Secondary | ICD-10-CM | POA: Diagnosis not present

## 2021-06-14 DIAGNOSIS — R54 Age-related physical debility: Secondary | ICD-10-CM | POA: Diagnosis not present

## 2021-06-14 DIAGNOSIS — R451 Restlessness and agitation: Secondary | ICD-10-CM | POA: Diagnosis not present

## 2021-06-14 DIAGNOSIS — F419 Anxiety disorder, unspecified: Secondary | ICD-10-CM | POA: Diagnosis not present

## 2021-06-27 DIAGNOSIS — F0390 Unspecified dementia without behavioral disturbance: Secondary | ICD-10-CM | POA: Diagnosis not present

## 2021-06-27 DIAGNOSIS — F062 Psychotic disorder with delusions due to known physiological condition: Secondary | ICD-10-CM | POA: Diagnosis not present

## 2021-06-27 DIAGNOSIS — F419 Anxiety disorder, unspecified: Secondary | ICD-10-CM | POA: Diagnosis not present

## 2021-06-27 DIAGNOSIS — N390 Urinary tract infection, site not specified: Secondary | ICD-10-CM | POA: Diagnosis not present

## 2021-06-27 DIAGNOSIS — D72829 Elevated white blood cell count, unspecified: Secondary | ICD-10-CM | POA: Diagnosis not present

## 2021-06-27 DIAGNOSIS — R0989 Other specified symptoms and signs involving the circulatory and respiratory systems: Secondary | ICD-10-CM | POA: Diagnosis not present

## 2021-06-27 DIAGNOSIS — R4182 Altered mental status, unspecified: Secondary | ICD-10-CM | POA: Diagnosis not present

## 2021-06-27 DIAGNOSIS — N184 Chronic kidney disease, stage 4 (severe): Secondary | ICD-10-CM | POA: Diagnosis not present

## 2021-06-27 DIAGNOSIS — R5383 Other fatigue: Secondary | ICD-10-CM | POA: Diagnosis not present

## 2021-06-27 DIAGNOSIS — F341 Dysthymic disorder: Secondary | ICD-10-CM | POA: Diagnosis not present

## 2021-06-28 DIAGNOSIS — F0390 Unspecified dementia without behavioral disturbance: Secondary | ICD-10-CM | POA: Diagnosis not present

## 2021-06-28 DIAGNOSIS — J449 Chronic obstructive pulmonary disease, unspecified: Secondary | ICD-10-CM | POA: Diagnosis not present

## 2021-06-28 DIAGNOSIS — R5383 Other fatigue: Secondary | ICD-10-CM | POA: Diagnosis not present

## 2021-06-28 DIAGNOSIS — J189 Pneumonia, unspecified organism: Secondary | ICD-10-CM | POA: Diagnosis not present

## 2021-06-28 DIAGNOSIS — R4182 Altered mental status, unspecified: Secondary | ICD-10-CM | POA: Diagnosis not present

## 2021-07-11 DIAGNOSIS — Z23 Encounter for immunization: Secondary | ICD-10-CM | POA: Diagnosis not present

## 2021-07-12 DIAGNOSIS — E119 Type 2 diabetes mellitus without complications: Secondary | ICD-10-CM | POA: Diagnosis not present

## 2021-07-12 DIAGNOSIS — D649 Anemia, unspecified: Secondary | ICD-10-CM | POA: Diagnosis not present

## 2021-07-12 DIAGNOSIS — I10 Essential (primary) hypertension: Secondary | ICD-10-CM | POA: Diagnosis not present

## 2021-07-13 DIAGNOSIS — E1122 Type 2 diabetes mellitus with diabetic chronic kidney disease: Secondary | ICD-10-CM | POA: Diagnosis not present

## 2021-07-13 DIAGNOSIS — R54 Age-related physical debility: Secondary | ICD-10-CM | POA: Diagnosis not present

## 2021-07-13 DIAGNOSIS — N184 Chronic kidney disease, stage 4 (severe): Secondary | ICD-10-CM | POA: Diagnosis not present

## 2021-07-13 DIAGNOSIS — F0390 Unspecified dementia without behavioral disturbance: Secondary | ICD-10-CM | POA: Diagnosis not present

## 2021-07-13 DIAGNOSIS — R799 Abnormal finding of blood chemistry, unspecified: Secondary | ICD-10-CM | POA: Diagnosis not present

## 2021-07-20 DIAGNOSIS — R54 Age-related physical debility: Secondary | ICD-10-CM | POA: Diagnosis not present

## 2021-07-20 DIAGNOSIS — R5381 Other malaise: Secondary | ICD-10-CM | POA: Diagnosis not present

## 2021-07-20 DIAGNOSIS — R630 Anorexia: Secondary | ICD-10-CM | POA: Diagnosis not present

## 2021-07-20 DIAGNOSIS — F0390 Unspecified dementia without behavioral disturbance: Secondary | ICD-10-CM | POA: Diagnosis not present

## 2021-07-20 DIAGNOSIS — E119 Type 2 diabetes mellitus without complications: Secondary | ICD-10-CM | POA: Diagnosis not present

## 2021-07-31 DIAGNOSIS — L609 Nail disorder, unspecified: Secondary | ICD-10-CM | POA: Diagnosis not present

## 2021-08-01 DIAGNOSIS — E1122 Type 2 diabetes mellitus with diabetic chronic kidney disease: Secondary | ICD-10-CM | POA: Diagnosis not present

## 2021-08-01 DIAGNOSIS — I129 Hypertensive chronic kidney disease with stage 1 through stage 4 chronic kidney disease, or unspecified chronic kidney disease: Secondary | ICD-10-CM | POA: Diagnosis not present

## 2021-08-01 DIAGNOSIS — F419 Anxiety disorder, unspecified: Secondary | ICD-10-CM | POA: Diagnosis not present

## 2021-08-01 DIAGNOSIS — E1129 Type 2 diabetes mellitus with other diabetic kidney complication: Secondary | ICD-10-CM | POA: Diagnosis not present

## 2021-08-01 DIAGNOSIS — F062 Psychotic disorder with delusions due to known physiological condition: Secondary | ICD-10-CM | POA: Diagnosis not present

## 2021-08-01 DIAGNOSIS — M109 Gout, unspecified: Secondary | ICD-10-CM | POA: Diagnosis not present

## 2021-08-01 DIAGNOSIS — J449 Chronic obstructive pulmonary disease, unspecified: Secondary | ICD-10-CM | POA: Diagnosis not present

## 2021-08-01 DIAGNOSIS — F0390 Unspecified dementia without behavioral disturbance: Secondary | ICD-10-CM | POA: Diagnosis not present

## 2021-08-01 DIAGNOSIS — N184 Chronic kidney disease, stage 4 (severe): Secondary | ICD-10-CM | POA: Diagnosis not present

## 2021-08-01 DIAGNOSIS — M1990 Unspecified osteoarthritis, unspecified site: Secondary | ICD-10-CM | POA: Diagnosis not present

## 2021-08-01 DIAGNOSIS — F341 Dysthymic disorder: Secondary | ICD-10-CM | POA: Diagnosis not present

## 2021-08-01 DIAGNOSIS — G894 Chronic pain syndrome: Secondary | ICD-10-CM | POA: Diagnosis not present

## 2021-08-02 DIAGNOSIS — E785 Hyperlipidemia, unspecified: Secondary | ICD-10-CM | POA: Diagnosis not present

## 2021-08-02 DIAGNOSIS — E559 Vitamin D deficiency, unspecified: Secondary | ICD-10-CM | POA: Diagnosis not present

## 2021-08-02 DIAGNOSIS — E039 Hypothyroidism, unspecified: Secondary | ICD-10-CM | POA: Diagnosis not present

## 2021-08-08 DIAGNOSIS — E785 Hyperlipidemia, unspecified: Secondary | ICD-10-CM | POA: Diagnosis not present

## 2021-08-08 DIAGNOSIS — N184 Chronic kidney disease, stage 4 (severe): Secondary | ICD-10-CM | POA: Diagnosis not present

## 2021-08-08 DIAGNOSIS — E559 Vitamin D deficiency, unspecified: Secondary | ICD-10-CM | POA: Diagnosis not present

## 2021-08-08 DIAGNOSIS — F0390 Unspecified dementia without behavioral disturbance: Secondary | ICD-10-CM | POA: Diagnosis not present

## 2021-08-08 DIAGNOSIS — R799 Abnormal finding of blood chemistry, unspecified: Secondary | ICD-10-CM | POA: Diagnosis not present

## 2021-08-09 DIAGNOSIS — F0390 Unspecified dementia without behavioral disturbance: Secondary | ICD-10-CM | POA: Diagnosis not present

## 2021-08-09 DIAGNOSIS — R54 Age-related physical debility: Secondary | ICD-10-CM | POA: Diagnosis not present

## 2021-08-09 DIAGNOSIS — N184 Chronic kidney disease, stage 4 (severe): Secondary | ICD-10-CM | POA: Diagnosis not present

## 2021-08-14 DIAGNOSIS — F32A Depression, unspecified: Secondary | ICD-10-CM | POA: Diagnosis not present

## 2021-08-14 DIAGNOSIS — F419 Anxiety disorder, unspecified: Secondary | ICD-10-CM | POA: Diagnosis not present

## 2021-08-29 DIAGNOSIS — F341 Dysthymic disorder: Secondary | ICD-10-CM | POA: Diagnosis not present

## 2021-08-29 DIAGNOSIS — F419 Anxiety disorder, unspecified: Secondary | ICD-10-CM | POA: Diagnosis not present

## 2021-08-29 DIAGNOSIS — F062 Psychotic disorder with delusions due to known physiological condition: Secondary | ICD-10-CM | POA: Diagnosis not present

## 2021-09-04 DIAGNOSIS — F341 Dysthymic disorder: Secondary | ICD-10-CM | POA: Diagnosis not present

## 2021-09-04 DIAGNOSIS — F062 Psychotic disorder with delusions due to known physiological condition: Secondary | ICD-10-CM | POA: Diagnosis not present

## 2021-09-04 DIAGNOSIS — F419 Anxiety disorder, unspecified: Secondary | ICD-10-CM | POA: Diagnosis not present

## 2021-09-04 DIAGNOSIS — R451 Restlessness and agitation: Secondary | ICD-10-CM | POA: Diagnosis not present

## 2021-09-17 DIAGNOSIS — F419 Anxiety disorder, unspecified: Secondary | ICD-10-CM | POA: Diagnosis not present

## 2021-09-17 DIAGNOSIS — F341 Dysthymic disorder: Secondary | ICD-10-CM | POA: Diagnosis not present

## 2021-09-21 DIAGNOSIS — F0390 Unspecified dementia without behavioral disturbance: Secondary | ICD-10-CM | POA: Diagnosis not present

## 2021-09-21 DIAGNOSIS — L299 Pruritus, unspecified: Secondary | ICD-10-CM | POA: Diagnosis not present

## 2021-09-21 DIAGNOSIS — L304 Erythema intertrigo: Secondary | ICD-10-CM | POA: Diagnosis not present

## 2021-09-26 DIAGNOSIS — F341 Dysthymic disorder: Secondary | ICD-10-CM | POA: Diagnosis not present

## 2021-09-26 DIAGNOSIS — F419 Anxiety disorder, unspecified: Secondary | ICD-10-CM | POA: Diagnosis not present

## 2021-09-26 DIAGNOSIS — F062 Psychotic disorder with delusions due to known physiological condition: Secondary | ICD-10-CM | POA: Diagnosis not present

## 2021-10-02 DIAGNOSIS — E559 Vitamin D deficiency, unspecified: Secondary | ICD-10-CM | POA: Diagnosis not present

## 2021-10-03 DIAGNOSIS — R799 Abnormal finding of blood chemistry, unspecified: Secondary | ICD-10-CM | POA: Diagnosis not present

## 2021-10-03 DIAGNOSIS — F341 Dysthymic disorder: Secondary | ICD-10-CM | POA: Diagnosis not present

## 2021-10-03 DIAGNOSIS — E559 Vitamin D deficiency, unspecified: Secondary | ICD-10-CM | POA: Diagnosis not present

## 2021-10-03 DIAGNOSIS — F0390 Unspecified dementia without behavioral disturbance: Secondary | ICD-10-CM | POA: Diagnosis not present

## 2021-10-03 DIAGNOSIS — F419 Anxiety disorder, unspecified: Secondary | ICD-10-CM | POA: Diagnosis not present

## 2021-10-04 DIAGNOSIS — F0390 Unspecified dementia without behavioral disturbance: Secondary | ICD-10-CM | POA: Diagnosis not present

## 2021-10-04 DIAGNOSIS — F419 Anxiety disorder, unspecified: Secondary | ICD-10-CM | POA: Diagnosis not present

## 2021-10-04 DIAGNOSIS — R451 Restlessness and agitation: Secondary | ICD-10-CM | POA: Diagnosis not present

## 2021-10-16 DIAGNOSIS — F341 Dysthymic disorder: Secondary | ICD-10-CM | POA: Diagnosis not present

## 2021-10-16 DIAGNOSIS — F419 Anxiety disorder, unspecified: Secondary | ICD-10-CM | POA: Diagnosis not present

## 2021-10-17 DIAGNOSIS — E119 Type 2 diabetes mellitus without complications: Secondary | ICD-10-CM | POA: Diagnosis not present

## 2021-10-17 DIAGNOSIS — L249 Irritant contact dermatitis, unspecified cause: Secondary | ICD-10-CM | POA: Diagnosis not present

## 2021-10-17 DIAGNOSIS — F0390 Unspecified dementia without behavioral disturbance: Secondary | ICD-10-CM | POA: Diagnosis not present

## 2021-10-17 DIAGNOSIS — L304 Erythema intertrigo: Secondary | ICD-10-CM | POA: Diagnosis not present

## 2021-11-09 DIAGNOSIS — E785 Hyperlipidemia, unspecified: Secondary | ICD-10-CM | POA: Diagnosis not present

## 2021-11-20 DIAGNOSIS — L609 Nail disorder, unspecified: Secondary | ICD-10-CM | POA: Diagnosis not present

## 2021-11-27 DIAGNOSIS — F419 Anxiety disorder, unspecified: Secondary | ICD-10-CM | POA: Diagnosis not present

## 2021-11-27 DIAGNOSIS — E559 Vitamin D deficiency, unspecified: Secondary | ICD-10-CM | POA: Diagnosis not present

## 2021-11-27 DIAGNOSIS — F341 Dysthymic disorder: Secondary | ICD-10-CM | POA: Diagnosis not present

## 2021-11-30 DIAGNOSIS — R0902 Hypoxemia: Secondary | ICD-10-CM | POA: Diagnosis not present

## 2021-11-30 DIAGNOSIS — N390 Urinary tract infection, site not specified: Secondary | ICD-10-CM | POA: Diagnosis not present

## 2021-11-30 DIAGNOSIS — I10 Essential (primary) hypertension: Secondary | ICD-10-CM | POA: Diagnosis not present

## 2021-11-30 DIAGNOSIS — G9341 Metabolic encephalopathy: Secondary | ICD-10-CM | POA: Diagnosis not present

## 2021-11-30 DIAGNOSIS — J449 Chronic obstructive pulmonary disease, unspecified: Secondary | ICD-10-CM | POA: Diagnosis not present

## 2021-11-30 DIAGNOSIS — J9611 Chronic respiratory failure with hypoxia: Secondary | ICD-10-CM | POA: Diagnosis not present

## 2021-11-30 DIAGNOSIS — Z7401 Bed confinement status: Secondary | ICD-10-CM | POA: Diagnosis not present

## 2021-11-30 DIAGNOSIS — E162 Hypoglycemia, unspecified: Secondary | ICD-10-CM | POA: Diagnosis not present

## 2021-11-30 DIAGNOSIS — Z9981 Dependence on supplemental oxygen: Secondary | ICD-10-CM | POA: Diagnosis not present

## 2021-11-30 DIAGNOSIS — F32A Depression, unspecified: Secondary | ICD-10-CM | POA: Diagnosis not present

## 2021-11-30 DIAGNOSIS — F419 Anxiety disorder, unspecified: Secondary | ICD-10-CM | POA: Diagnosis not present

## 2021-11-30 DIAGNOSIS — N189 Chronic kidney disease, unspecified: Secondary | ICD-10-CM | POA: Diagnosis not present

## 2021-11-30 DIAGNOSIS — E161 Other hypoglycemia: Secondary | ICD-10-CM | POA: Diagnosis not present

## 2021-11-30 DIAGNOSIS — E11649 Type 2 diabetes mellitus with hypoglycemia without coma: Secondary | ICD-10-CM | POA: Diagnosis not present

## 2021-11-30 DIAGNOSIS — R404 Transient alteration of awareness: Secondary | ICD-10-CM | POA: Diagnosis not present

## 2021-11-30 DIAGNOSIS — N179 Acute kidney failure, unspecified: Secondary | ICD-10-CM | POA: Diagnosis not present

## 2021-12-01 DIAGNOSIS — G9341 Metabolic encephalopathy: Secondary | ICD-10-CM | POA: Diagnosis not present

## 2021-12-01 DIAGNOSIS — E11649 Type 2 diabetes mellitus with hypoglycemia without coma: Secondary | ICD-10-CM | POA: Diagnosis not present

## 2021-12-01 DIAGNOSIS — J9611 Chronic respiratory failure with hypoxia: Secondary | ICD-10-CM | POA: Diagnosis not present

## 2021-12-02 DIAGNOSIS — R0902 Hypoxemia: Secondary | ICD-10-CM | POA: Diagnosis not present

## 2021-12-02 DIAGNOSIS — Z7401 Bed confinement status: Secondary | ICD-10-CM | POA: Diagnosis not present

## 2021-12-02 DIAGNOSIS — E11649 Type 2 diabetes mellitus with hypoglycemia without coma: Secondary | ICD-10-CM | POA: Diagnosis not present

## 2021-12-02 DIAGNOSIS — J9611 Chronic respiratory failure with hypoxia: Secondary | ICD-10-CM | POA: Diagnosis not present

## 2021-12-02 DIAGNOSIS — G9341 Metabolic encephalopathy: Secondary | ICD-10-CM | POA: Diagnosis not present

## 2021-12-04 DIAGNOSIS — R410 Disorientation, unspecified: Secondary | ICD-10-CM | POA: Diagnosis not present

## 2021-12-04 DIAGNOSIS — H02846 Edema of left eye, unspecified eyelid: Secondary | ICD-10-CM | POA: Diagnosis not present

## 2021-12-04 DIAGNOSIS — F0390 Unspecified dementia without behavioral disturbance: Secondary | ICD-10-CM | POA: Diagnosis not present

## 2021-12-04 DIAGNOSIS — M109 Gout, unspecified: Secondary | ICD-10-CM | POA: Diagnosis not present

## 2021-12-04 DIAGNOSIS — R231 Pallor: Secondary | ICD-10-CM | POA: Diagnosis not present

## 2021-12-04 DIAGNOSIS — Z794 Long term (current) use of insulin: Secondary | ICD-10-CM | POA: Diagnosis not present

## 2021-12-04 DIAGNOSIS — G4700 Insomnia, unspecified: Secondary | ICD-10-CM | POA: Diagnosis not present

## 2021-12-04 DIAGNOSIS — G9341 Metabolic encephalopathy: Secondary | ICD-10-CM | POA: Diagnosis not present

## 2021-12-04 DIAGNOSIS — F03C0 Unspecified dementia, severe, without behavioral disturbance, psychotic disturbance, mood disturbance, and anxiety: Secondary | ICD-10-CM | POA: Diagnosis not present

## 2021-12-04 DIAGNOSIS — N184 Chronic kidney disease, stage 4 (severe): Secondary | ICD-10-CM | POA: Diagnosis not present

## 2021-12-04 DIAGNOSIS — I10 Essential (primary) hypertension: Secondary | ICD-10-CM | POA: Diagnosis not present

## 2021-12-04 DIAGNOSIS — M7989 Other specified soft tissue disorders: Secondary | ICD-10-CM | POA: Diagnosis not present

## 2021-12-04 DIAGNOSIS — R239 Unspecified skin changes: Secondary | ICD-10-CM | POA: Diagnosis not present

## 2021-12-04 DIAGNOSIS — R0902 Hypoxemia: Secondary | ICD-10-CM | POA: Diagnosis not present

## 2021-12-04 DIAGNOSIS — Z79899 Other long term (current) drug therapy: Secondary | ICD-10-CM | POA: Diagnosis not present

## 2021-12-04 DIAGNOSIS — E1122 Type 2 diabetes mellitus with diabetic chronic kidney disease: Secondary | ICD-10-CM | POA: Diagnosis not present

## 2021-12-04 DIAGNOSIS — R4182 Altered mental status, unspecified: Secondary | ICD-10-CM | POA: Diagnosis not present

## 2021-12-05 DIAGNOSIS — R4182 Altered mental status, unspecified: Secondary | ICD-10-CM | POA: Diagnosis not present

## 2021-12-05 DIAGNOSIS — N184 Chronic kidney disease, stage 4 (severe): Secondary | ICD-10-CM | POA: Diagnosis not present

## 2021-12-05 DIAGNOSIS — F03C0 Unspecified dementia, severe, without behavioral disturbance, psychotic disturbance, mood disturbance, and anxiety: Secondary | ICD-10-CM | POA: Diagnosis not present

## 2021-12-06 DIAGNOSIS — F03C0 Unspecified dementia, severe, without behavioral disturbance, psychotic disturbance, mood disturbance, and anxiety: Secondary | ICD-10-CM | POA: Diagnosis not present

## 2021-12-06 DIAGNOSIS — N184 Chronic kidney disease, stage 4 (severe): Secondary | ICD-10-CM | POA: Diagnosis not present

## 2021-12-06 DIAGNOSIS — R4182 Altered mental status, unspecified: Secondary | ICD-10-CM | POA: Diagnosis not present

## 2021-12-07 DIAGNOSIS — R4182 Altered mental status, unspecified: Secondary | ICD-10-CM | POA: Diagnosis not present

## 2021-12-07 DIAGNOSIS — N184 Chronic kidney disease, stage 4 (severe): Secondary | ICD-10-CM | POA: Diagnosis not present

## 2021-12-07 DIAGNOSIS — F03C0 Unspecified dementia, severe, without behavioral disturbance, psychotic disturbance, mood disturbance, and anxiety: Secondary | ICD-10-CM | POA: Diagnosis not present

## 2021-12-08 DIAGNOSIS — N184 Chronic kidney disease, stage 4 (severe): Secondary | ICD-10-CM | POA: Diagnosis not present

## 2021-12-08 DIAGNOSIS — F03C0 Unspecified dementia, severe, without behavioral disturbance, psychotic disturbance, mood disturbance, and anxiety: Secondary | ICD-10-CM | POA: Diagnosis not present

## 2021-12-08 DIAGNOSIS — R4182 Altered mental status, unspecified: Secondary | ICD-10-CM | POA: Diagnosis not present

## 2021-12-09 DIAGNOSIS — R4182 Altered mental status, unspecified: Secondary | ICD-10-CM | POA: Diagnosis not present

## 2021-12-09 DIAGNOSIS — N184 Chronic kidney disease, stage 4 (severe): Secondary | ICD-10-CM | POA: Diagnosis not present

## 2021-12-09 DIAGNOSIS — F03C0 Unspecified dementia, severe, without behavioral disturbance, psychotic disturbance, mood disturbance, and anxiety: Secondary | ICD-10-CM | POA: Diagnosis not present

## 2021-12-10 DIAGNOSIS — N184 Chronic kidney disease, stage 4 (severe): Secondary | ICD-10-CM | POA: Diagnosis not present

## 2021-12-10 DIAGNOSIS — F03C0 Unspecified dementia, severe, without behavioral disturbance, psychotic disturbance, mood disturbance, and anxiety: Secondary | ICD-10-CM | POA: Diagnosis not present

## 2021-12-10 DIAGNOSIS — R4182 Altered mental status, unspecified: Secondary | ICD-10-CM | POA: Diagnosis not present

## 2021-12-11 DIAGNOSIS — F03C0 Unspecified dementia, severe, without behavioral disturbance, psychotic disturbance, mood disturbance, and anxiety: Secondary | ICD-10-CM | POA: Diagnosis not present

## 2021-12-11 DIAGNOSIS — N184 Chronic kidney disease, stage 4 (severe): Secondary | ICD-10-CM | POA: Diagnosis not present

## 2021-12-11 DIAGNOSIS — R4182 Altered mental status, unspecified: Secondary | ICD-10-CM | POA: Diagnosis not present

## 2021-12-12 DIAGNOSIS — R4182 Altered mental status, unspecified: Secondary | ICD-10-CM | POA: Diagnosis not present

## 2021-12-12 DIAGNOSIS — N184 Chronic kidney disease, stage 4 (severe): Secondary | ICD-10-CM | POA: Diagnosis not present

## 2021-12-12 DIAGNOSIS — F03C0 Unspecified dementia, severe, without behavioral disturbance, psychotic disturbance, mood disturbance, and anxiety: Secondary | ICD-10-CM | POA: Diagnosis not present

## 2021-12-13 DIAGNOSIS — R4182 Altered mental status, unspecified: Secondary | ICD-10-CM | POA: Diagnosis not present

## 2021-12-13 DIAGNOSIS — N184 Chronic kidney disease, stage 4 (severe): Secondary | ICD-10-CM | POA: Diagnosis not present

## 2021-12-13 DIAGNOSIS — F03C0 Unspecified dementia, severe, without behavioral disturbance, psychotic disturbance, mood disturbance, and anxiety: Secondary | ICD-10-CM | POA: Diagnosis not present

## 2021-12-14 DIAGNOSIS — R4182 Altered mental status, unspecified: Secondary | ICD-10-CM | POA: Diagnosis not present

## 2021-12-14 DIAGNOSIS — N184 Chronic kidney disease, stage 4 (severe): Secondary | ICD-10-CM | POA: Diagnosis not present

## 2021-12-14 DIAGNOSIS — F03C0 Unspecified dementia, severe, without behavioral disturbance, psychotic disturbance, mood disturbance, and anxiety: Secondary | ICD-10-CM | POA: Diagnosis not present

## 2021-12-15 DIAGNOSIS — R4182 Altered mental status, unspecified: Secondary | ICD-10-CM | POA: Diagnosis not present

## 2021-12-15 DIAGNOSIS — F03C0 Unspecified dementia, severe, without behavioral disturbance, psychotic disturbance, mood disturbance, and anxiety: Secondary | ICD-10-CM | POA: Diagnosis not present

## 2021-12-15 DIAGNOSIS — N184 Chronic kidney disease, stage 4 (severe): Secondary | ICD-10-CM | POA: Diagnosis not present

## 2021-12-16 DIAGNOSIS — N184 Chronic kidney disease, stage 4 (severe): Secondary | ICD-10-CM | POA: Diagnosis not present

## 2021-12-16 DIAGNOSIS — F03C0 Unspecified dementia, severe, without behavioral disturbance, psychotic disturbance, mood disturbance, and anxiety: Secondary | ICD-10-CM | POA: Diagnosis not present

## 2021-12-16 DIAGNOSIS — R4182 Altered mental status, unspecified: Secondary | ICD-10-CM | POA: Diagnosis not present

## 2021-12-17 DIAGNOSIS — F03C0 Unspecified dementia, severe, without behavioral disturbance, psychotic disturbance, mood disturbance, and anxiety: Secondary | ICD-10-CM | POA: Diagnosis not present

## 2021-12-17 DIAGNOSIS — R4182 Altered mental status, unspecified: Secondary | ICD-10-CM | POA: Diagnosis not present

## 2021-12-17 DIAGNOSIS — N184 Chronic kidney disease, stage 4 (severe): Secondary | ICD-10-CM | POA: Diagnosis not present

## 2021-12-18 DIAGNOSIS — F03C0 Unspecified dementia, severe, without behavioral disturbance, psychotic disturbance, mood disturbance, and anxiety: Secondary | ICD-10-CM | POA: Diagnosis not present

## 2021-12-18 DIAGNOSIS — N184 Chronic kidney disease, stage 4 (severe): Secondary | ICD-10-CM | POA: Diagnosis not present

## 2021-12-18 DIAGNOSIS — R4182 Altered mental status, unspecified: Secondary | ICD-10-CM | POA: Diagnosis not present

## 2021-12-19 DIAGNOSIS — N184 Chronic kidney disease, stage 4 (severe): Secondary | ICD-10-CM | POA: Diagnosis not present

## 2021-12-19 DIAGNOSIS — R4182 Altered mental status, unspecified: Secondary | ICD-10-CM | POA: Diagnosis not present

## 2021-12-19 DIAGNOSIS — F03C0 Unspecified dementia, severe, without behavioral disturbance, psychotic disturbance, mood disturbance, and anxiety: Secondary | ICD-10-CM | POA: Diagnosis not present

## 2021-12-20 DIAGNOSIS — F03C0 Unspecified dementia, severe, without behavioral disturbance, psychotic disturbance, mood disturbance, and anxiety: Secondary | ICD-10-CM | POA: Diagnosis not present

## 2021-12-20 DIAGNOSIS — R4182 Altered mental status, unspecified: Secondary | ICD-10-CM | POA: Diagnosis not present

## 2021-12-20 DIAGNOSIS — N184 Chronic kidney disease, stage 4 (severe): Secondary | ICD-10-CM | POA: Diagnosis not present

## 2021-12-21 DIAGNOSIS — N184 Chronic kidney disease, stage 4 (severe): Secondary | ICD-10-CM | POA: Diagnosis not present

## 2021-12-21 DIAGNOSIS — Z743 Need for continuous supervision: Secondary | ICD-10-CM | POA: Diagnosis not present

## 2021-12-21 DIAGNOSIS — J9600 Acute respiratory failure, unspecified whether with hypoxia or hypercapnia: Secondary | ICD-10-CM | POA: Diagnosis not present

## 2021-12-21 DIAGNOSIS — F03C0 Unspecified dementia, severe, without behavioral disturbance, psychotic disturbance, mood disturbance, and anxiety: Secondary | ICD-10-CM | POA: Diagnosis not present

## 2021-12-21 DIAGNOSIS — R4182 Altered mental status, unspecified: Secondary | ICD-10-CM | POA: Diagnosis not present
